# Patient Record
Sex: Female | Born: 1990 | Marital: Married | Smoking: Former smoker
Health system: Southern US, Community
[De-identification: ages and names within clinical notes are randomized; demographics above are authoritative.]

## PROBLEM LIST (undated history)

## (undated) DIAGNOSIS — C801 Malignant (primary) neoplasm, unspecified: Secondary | ICD-10-CM

## (undated) DIAGNOSIS — F329 Major depressive disorder, single episode, unspecified: Secondary | ICD-10-CM

## (undated) DIAGNOSIS — T4145XA Adverse effect of unspecified anesthetic, initial encounter: Secondary | ICD-10-CM

## (undated) DIAGNOSIS — F410 Panic disorder [episodic paroxysmal anxiety] without agoraphobia: Secondary | ICD-10-CM

## (undated) DIAGNOSIS — D069 Carcinoma in situ of cervix, unspecified: Secondary | ICD-10-CM

## (undated) DIAGNOSIS — F419 Anxiety disorder, unspecified: Secondary | ICD-10-CM

## (undated) DIAGNOSIS — B977 Papillomavirus as the cause of diseases classified elsewhere: Secondary | ICD-10-CM

## (undated) DIAGNOSIS — T8859XA Other complications of anesthesia, initial encounter: Secondary | ICD-10-CM

## (undated) DIAGNOSIS — F32A Depression, unspecified: Secondary | ICD-10-CM

## (undated) DIAGNOSIS — F4310 Post-traumatic stress disorder, unspecified: Secondary | ICD-10-CM

## (undated) HISTORY — PX: CERVICAL BIOPSY  W/ LOOP ELECTRODE EXCISION: SUR135

## (undated) HISTORY — DX: Major depressive disorder, single episode, unspecified: F32.9

## (undated) HISTORY — DX: Anxiety disorder, unspecified: F41.9

## (undated) HISTORY — DX: Carcinoma in situ of cervix, unspecified: D06.9

## (undated) HISTORY — DX: Depression, unspecified: F32.A

## (undated) HISTORY — DX: Panic disorder (episodic paroxysmal anxiety): F41.0

## (undated) HISTORY — PX: DENTAL SURGERY: SHX609

## (undated) HISTORY — DX: Papillomavirus as the cause of diseases classified elsewhere: B97.7

## (undated) NOTE — *Deleted (*Deleted)
Date of delivery: 03/18/2020 Estimated Date of Delivery: 03/18/20 Patient's last menstrual period was 05/10/2019 (approximate). EGA: [redacted]w[redacted]d  Delivery Note At 7:54 AM a viable female was delivered via Vaginal, Spontaneous (Presentation: OA, ROA).  APGAR: 7, 8; weight: pending   Placenta status: Spontaneous, Intact. Trailing membranes removed with ring forceps.  Cord: 3 vessels with the following complications: None.  Cord pH: NA  Patient with bulging bag and complete after AROM of clear fluid. She pushed well to deliver a viable female infant.  The head followed by shoulders, which delivered without difficulty, and the rest of the body.  No nuchal cord noted.  Baby to mom's chest.  Cord clamped and cut after 2 min delay.  Baby taken to warmer in care of nursery team for further transition care. Deep suctioning done with improvement of O2 Sats. Cord blood obtained.  Placenta delivered spontaneously, intact, with a 3-vessel cord.  First degree perineal laceration hemostatic and not requiring repair. Mom and baby in stable condition.  All counts correct.  Hemostasis obtained with IV pitocin and fundal massage.     Anesthesia: Epidural Episiotomy: None Lacerations: 1st degree Suture Repair: NA Est. Blood Loss (mL): 300  Mom to postpartum.  Baby to Couplet care / Skin to Skin.  Tresea Mall, CNM 03/18/2020, 8:24 AM

---

## 2017-09-06 ENCOUNTER — Telehealth: Payer: Self-pay | Admitting: Obstetrics & Gynecology

## 2017-09-06 ENCOUNTER — Encounter: Payer: Self-pay | Admitting: Obstetrics and Gynecology

## 2017-09-06 ENCOUNTER — Ambulatory Visit (INDEPENDENT_AMBULATORY_CARE_PROVIDER_SITE_OTHER): Payer: Medicaid Other | Admitting: Obstetrics and Gynecology

## 2017-09-06 VITALS — BP 118/74 | Ht 66.0 in | Wt 134.0 lb

## 2017-09-06 DIAGNOSIS — N751 Abscess of Bartholin's gland: Secondary | ICD-10-CM

## 2017-09-06 NOTE — Progress Notes (Signed)
Obstetrics & Gynecology Office Visit   Chief Complaint  Patient presents with  . Bartholin's Cyst  Referral from Gennette Pac, [place], at [facility]  History of Present Illness: 27 y.o. [phone] female who is seen in referral from Gennette Pac, [place], at [facility] for a Bartholin's cyst. She states the cyst is located on her left.  She first noted it two years ago and it didn't cause her any pain. The initial approach was to monitor it. Between 4-6 weeks ago it became larger and during sex it became painful.  She started having terrible pain 3-4 days.  She has been doing hot sitz baths, hot compresses, ice, lubricant (to wear underwear).  The cyst drained for a short period of time and then stopped (about day 2).  She was prescribed Bactrim yesterday and she is taking tylenol and ibuprofen for the pain, and the pain is somewhat improved. She still has difficulty walking and sitting, but the pain is not as bad as yesterday.  However, it is still severe.  She denies fevers, chills.  Last pap smear 1 year ago and it was normal.  She has never had an STD besides HPV.   Her menses are very irregular when not on NuvaRing. She has been off of this for some time.   Past Medical History:  Diagnosis Date  . Anxiety and depression   . 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 HPV (human papilloma virus) infection   . Panic disorder    Past Surgical History:  Procedure Laterality Date  . CERVICAL BIOPSY  W/ LOOP ELECTRODE EXCISION     Gynecologic History: Patient's last menstrual period was 08/01/2017.  Obstetric History: [id number], s/p SVD x 1 with episiotomy  Family History  Problem Relation Age of Onset  . Colon cancer Mother   . Hypertension Mother   . Stroke Mother     Social History   Socioeconomic History  . Marital status: Married    Spouse name: Not on file  . Number of children: Not on file  .  Years of education: Not on file  . Highest education level: Not on file  Occupational History  . Not on file  Social Needs  . Financial resource strain: Not on file  . Food insecurity:    Worry: Not on file    Inability: Not on file  . Transportation needs:    Medical: Not on file    Non-medical: Not on file  Tobacco Use  . Smoking status: Former Research scientist (life sciences)  . Smokeless tobacco: Never Used  Substance and Sexual Activity  . Alcohol use: Not Currently  . Drug use: Yes    Types: Marijuana  . Sexual activity: Not Currently    Birth control/protection: None  Lifestyle  . Physical activity:    Days per week: Not on file    Minutes per session: Not on file  . Stress: Not on file  Relationships  . Social connections:    Talks on phone: Not on file    Gets together: Not on file    Attends religious service: Not on file    Active member of club or organization: Not on file    Attends meetings of clubs or organizations: Not on file    Relationship status: Not on file  . Intimate partner violence:    Fear of current or ex partner: Not on file    Emotionally abused: Not  on file    Physically abused: Not on file    Forced sexual activity: Not on file  Other Topics Concern  . Not on file  Social History Narrative  . Not on file    Allergies  Allergen Reactions  . Shellfish Allergy   . Zithromax [Azithromycin]     Medications   Bactrim DS   Review of Systems  Constitutional: Negative.   HENT: Negative.   Eyes: Negative.   Respiratory: Negative.   Cardiovascular: Negative.   Gastrointestinal: Negative.   Genitourinary: Negative.        See HPI for details  Musculoskeletal: Negative.   Skin: Negative.   Neurological: Negative.   Endo/Heme/Allergies: Negative.   Psychiatric/Behavioral: Negative.      Physical Exam BP 118/74   Ht 5\' 6"  (1.676 m)   Wt 134 lb (60.8 kg)   LMP 08/01/2017   BMI 21.63 kg/m  Patient's last menstrual period was 08/01/2017. Physical Exam    Constitutional: She is oriented to person, place, and time. She appears well-developed and well-nourished. No distress.  Genitourinary: Pelvic exam was performed with patient supine. There is no rash, tenderness or lesion on the right labia.  There is tenderness, lesion and Bartholin's cyst on the left labia. There is no rash on the left labia.     HENT:  Head: Normocephalic and atraumatic.  Eyes: EOM are normal. No scleral icterus.  Neck: Normal range of motion. Neck supple.  Cardiovascular: Normal rate and regular rhythm.  Pulmonary/Chest: Effort normal and breath sounds normal. No respiratory distress. She has no wheezes. She has no rales.  Abdominal: Soft. Bowel sounds are normal. She exhibits no distension and no mass. There is no tenderness. There is no rebound and no guarding.  Musculoskeletal: Normal range of motion. She exhibits no edema.  Lymphadenopathy:       Right: No inguinal adenopathy present.       Left: No inguinal adenopathy present.  Neurological: She is alert and oriented to person, place, and time. No cranial nerve deficit.  Skin: Skin is warm and dry. No erythema.  Psychiatric: She has a normal mood and affect. Her behavior is normal. Judgment normal.   Female chaperone present for pelvic and breast  portions of the physical exam  Assessment: 27 y.o. [id number] female here for  1. Bartholin's gland abscess      Plan: Problem List Items Addressed This Visit    None    Visit Diagnoses    Bartholin's gland abscess    -  Primary     Discussed treatment of Bartholin gland abscess with patient.  Discussed conservative management with continued sitz bath, continued antibiotics, procedure with placement of Word catheter, and marsupialization. Given her exam today, it was quite difficult to place a Word catheter given the significant abscess size and location (difficult to isolate an area proximal to the hymenal ring). She elects marsupialization. Given the length of  time she has had the Bartholin gland abscess, I believe this is a reasonable idea.  Will schedule marsupialization for as soon as possible. She is to continue antibiotics. If her abscess reduces in size, may have to reschedule surgery.  25 minutes spent in face to face discussion with > 50% spent in counseling,management, and coordination of care of her left Bartholin gland abscess.   Prentice Docker, MD 09/06/2017 9:19 PM     CC: Gennette Pac, [place], [place] [id number]

## 2017-09-06 NOTE — Telephone Encounter (Signed)
[facility] clinic referring for Batholin's cyst abscess. Called and left voicemail for patient to call back.Marland KitchenMarland Kitchen

## 2017-09-06 NOTE — H&P (View-Only) (Signed)
Obstetrics & Gynecology Office Visit   Chief Complaint  Patient presents with  . Bartholin's Cyst  Referral from Gennette Pac, [place], at [facility]  History of Present Illness: 27 y.o. [phone] female who is seen in referral from Gennette Pac, [place], at [facility] for a Bartholin's cyst. She states the cyst is located on her left.  She first noted it two years ago and it didn't cause her any pain. The initial approach was to monitor it. Between 4-6 weeks ago it became larger and during sex it became painful.  She started having terrible pain 3-4 days.  She has been doing hot sitz baths, hot compresses, ice, lubricant (to wear underwear).  The cyst drained for a short period of time and then stopped (about day 2).  She was prescribed Bactrim yesterday and she is taking tylenol and ibuprofen for the pain, and the pain is somewhat improved. She still has difficulty walking and sitting, but the pain is not as bad as yesterday.  However, it is still severe.  She denies fevers, chills.  Last pap smear 1 year ago and it was normal.  She has never had an STD besides HPV.   Her menses are very irregular when not on NuvaRing. She has been off of this for some time.   Past Medical History:  Diagnosis Date  . Anxiety and depression   . 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 HPV (human papilloma virus) infection   . Panic disorder    Past Surgical History:  Procedure Laterality Date  . CERVICAL BIOPSY  W/ LOOP ELECTRODE EXCISION     Gynecologic History: Patient's last menstrual period was 08/01/2017.  Obstetric History: [id number], s/p SVD x 1 with episiotomy  Family History  Problem Relation Age of Onset  . Colon cancer Mother   . Hypertension Mother   . Stroke Mother     Social History   Socioeconomic History  . Marital status: Married    Spouse name: Not on file  . Number of children: Not on file  .  Years of education: Not on file  . Highest education level: Not on file  Occupational History  . Not on file  Social Needs  . Financial resource strain: Not on file  . Food insecurity:    Worry: Not on file    Inability: Not on file  . Transportation needs:    Medical: Not on file    Non-medical: Not on file  Tobacco Use  . Smoking status: Former Research scientist (life sciences)  . Smokeless tobacco: Never Used  Substance and Sexual Activity  . Alcohol use: Not Currently  . Drug use: Yes    Types: Marijuana  . Sexual activity: Not Currently    Birth control/protection: None  Lifestyle  . Physical activity:    Days per week: Not on file    Minutes per session: Not on file  . Stress: Not on file  Relationships  . Social connections:    Talks on phone: Not on file    Gets together: Not on file    Attends religious service: Not on file    Active member of club or organization: Not on file    Attends meetings of clubs or organizations: Not on file    Relationship status: Not on file  . Intimate partner violence:    Fear of current or ex partner: Not on file    Emotionally abused: Not  on file    Physically abused: Not on file    Forced sexual activity: Not on file  Other Topics Concern  . Not on file  Social History Narrative  . Not on file    Allergies  Allergen Reactions  . Shellfish Allergy   . Zithromax [Azithromycin]     Medications   Bactrim DS   Review of Systems  Constitutional: Negative.   HENT: Negative.   Eyes: Negative.   Respiratory: Negative.   Cardiovascular: Negative.   Gastrointestinal: Negative.   Genitourinary: Negative.        See HPI for details  Musculoskeletal: Negative.   Skin: Negative.   Neurological: Negative.   Endo/Heme/Allergies: Negative.   Psychiatric/Behavioral: Negative.      Physical Exam BP 118/74   Ht 5\' 6"  (1.676 m)   Wt 134 lb (60.8 kg)   LMP 08/01/2017   BMI 21.63 kg/m  Patient's last menstrual period was 08/01/2017. Physical Exam    Constitutional: She is oriented to person, place, and time. She appears well-developed and well-nourished. No distress.  Genitourinary: Pelvic exam was performed with patient supine. There is no rash, tenderness or lesion on the right labia.  There is tenderness, lesion and Bartholin's cyst on the left labia. There is no rash on the left labia.     HENT:  Head: Normocephalic and atraumatic.  Eyes: EOM are normal. No scleral icterus.  Neck: Normal range of motion. Neck supple.  Cardiovascular: Normal rate and regular rhythm.  Pulmonary/Chest: Effort normal and breath sounds normal. No respiratory distress. She has no wheezes. She has no rales.  Abdominal: Soft. Bowel sounds are normal. She exhibits no distension and no mass. There is no tenderness. There is no rebound and no guarding.  Musculoskeletal: Normal range of motion. She exhibits no edema.  Lymphadenopathy:       Right: No inguinal adenopathy present.       Left: No inguinal adenopathy present.  Neurological: She is alert and oriented to person, place, and time. No cranial nerve deficit.  Skin: Skin is warm and dry. No erythema.  Psychiatric: She has a normal mood and affect. Her behavior is normal. Judgment normal.   Female chaperone present for pelvic and breast  portions of the physical exam  Assessment: 27 y.o. [id number] female here for  1. Bartholin's gland abscess      Plan: Problem List Items Addressed This Visit    None    Visit Diagnoses    Bartholin's gland abscess    -  Primary     Discussed treatment of Bartholin gland abscess with patient.  Discussed conservative management with continued sitz bath, continued antibiotics, procedure with placement of Word catheter, and marsupialization. Given her exam today, it was quite difficult to place a Word catheter given the significant abscess size and location (difficult to isolate an area proximal to the hymenal ring). She elects marsupialization. Given the length of  time she has had the Bartholin gland abscess, I believe this is a reasonable idea.  Will schedule marsupialization for as soon as possible. She is to continue antibiotics. If her abscess reduces in size, may have to reschedule surgery.  25 minutes spent in face to face discussion with > 50% spent in counseling,management, and coordination of care of her left Bartholin gland abscess.   Prentice Docker, MD 09/06/2017 9:19 PM     CC: Gennette Pac, [place] [address] [place], [place] [id number]

## 2017-09-06 NOTE — Telephone Encounter (Signed)
Patient schedule 4//30/19 with SDJ

## 2017-09-07 ENCOUNTER — Encounter
Admission: RE | Admit: 2017-09-07 | Discharge: 2017-09-07 | Disposition: A | Discharge status: Home / Self Care | Payer: Medicaid Other | Source: Ambulatory Visit | Attending: Obstetrics and Gynecology | Admitting: Obstetrics and Gynecology

## 2017-09-07 ENCOUNTER — Encounter: Payer: Self-pay | Admitting: *Deleted

## 2017-09-07 ENCOUNTER — Other Ambulatory Visit: Payer: Self-pay

## 2017-09-07 HISTORY — DX: Other complications of anesthesia, initial encounter: T88.59XA

## 2017-09-07 HISTORY — DX: Malignant (primary) neoplasm, unspecified: C80.1

## 2017-09-07 HISTORY — DX: Adverse effect of unspecified anesthetic, initial encounter: T41.45XA

## 2017-09-07 HISTORY — DX: Major depressive disorder, single episode, unspecified: F32.9

## 2017-09-07 HISTORY — DX: Post-traumatic stress disorder, unspecified: F43.10

## 2017-09-07 HISTORY — DX: Depression, unspecified: F32.A

## 2017-09-07 NOTE — Patient Instructions (Signed)
Your procedure is scheduled on: 09/09/17 Report to Day Surgery. [facility]OND FLOOR To find out your arrival time please call [phone] between 1PM - 3PM on  09/08/17.  Remember: Instructions that are not followed completely may result in serious medical risk, up to and including death, or upon the discretion of your surgeon and anesthesiologist your surgery may need to be rescheduled.     _X__ 1. Do not eat food after midnight the night before your procedure.                 No gum chewing or hard candies. You may drink clear liquids up to 2 hours                 before you are scheduled to arrive for your surgery- DO not drink clear                 liquids within 2 hours of the start of your surgery.                 Clear Liquids include:  water, apple juice without pulp, clear carbohydrate                 drink such as Clearfast of Gartorade, Black Coffee or Tea (Do not add                 anything to coffee or tea).  __X__2.  On the morning of surgery brush your teeth with toothpaste and water, you                 may rinse your mouth with mouthwash if you wish.  Do not swallow any              toothpaste of mouthwash.     _X__ 3.  No Alcohol for 24 hours before or after surgery.   _X__ 4.  Do Not Smoke or use e-cigarettes For 24 Hours Prior to Your Surgery.                 Do not use any chewable tobacco products for at least 6 hours prior to                 surgery.  ____  5.  Bring all medications with you on the day of surgery if instructed.   _X___  6.  Notify your doctor if there is any change in your medical condition      (cold, fever, infections).     Do not wear jewelry, make-up, hairpins, clips or nail polish. Do not wear lotions, powders, or perfumes. You may wear deodorant. Do not shave 48 hours prior to surgery. Men may shave face and neck. Do not bring valuables to the hospital.    [facility] is not responsible for any belongings or  valuables.  Contacts, dentures or bridgework may not be worn into surgery. Leave your suitcase in the car. After surgery it may be brought to your room. For patients admitted to the hospital, discharge time is determined by your treatment team.   Patients discharged the day of surgery will not be allowed to drive home.   ____ Take these medicines the morning of surgery with A SIP OF WATER:    1.N0NE  2.   3.   4.  5.  6.  ____ Fleet Enema (as directed)   ____ Use CHG Soap as directed  ____ Use inhalers on the day of  surgery  ____ Stop metformin 2 days prior to surgery    ____ Take 1/2 of usual insulin dose the night before surgery. No insulin the morning          of surgery.   ____ Stop Coumadin/Plavix/aspirin on   ____ Stop Anti-inflammatories on    ____ Stop supplements until after surgery.    ____ Bring C-Pap to the hospital.

## 2017-09-09 ENCOUNTER — Ambulatory Visit: Payer: Medicaid Other | Admitting: Anesthesiology

## 2017-09-09 ENCOUNTER — Encounter
Admission: RE | Disposition: A | Discharge status: Home / Self Care | Payer: Self-pay | Source: Ambulatory Visit | Attending: Obstetrics and Gynecology

## 2017-09-09 ENCOUNTER — Other Ambulatory Visit: Payer: Self-pay

## 2017-09-09 ENCOUNTER — Encounter: Payer: Self-pay | Admitting: *Deleted

## 2017-09-09 ENCOUNTER — Ambulatory Visit
Admission: RE | Admit: 2017-09-09 | Discharge: 2017-09-09 | Disposition: A | Discharge status: Home / Self Care | Payer: Medicaid Other | Source: Ambulatory Visit | Attending: Obstetrics and Gynecology | Admitting: Obstetrics and Gynecology

## 2017-09-09 DIAGNOSIS — D069 Carcinoma in situ of cervix, unspecified: Secondary | ICD-10-CM | POA: Diagnosis not present

## 2017-09-09 DIAGNOSIS — N750 Cyst of Bartholin's gland: Secondary | ICD-10-CM | POA: Diagnosis not present

## 2017-09-09 DIAGNOSIS — Z79899 Other long term (current) drug therapy: Secondary | ICD-10-CM | POA: Insufficient documentation

## 2017-09-09 DIAGNOSIS — N751 Abscess of Bartholin's gland: Secondary | ICD-10-CM | POA: Diagnosis present

## 2017-09-09 DIAGNOSIS — Z87891 Personal history of nicotine dependence: Secondary | ICD-10-CM | POA: Insufficient documentation

## 2017-09-09 DIAGNOSIS — F419 Anxiety disorder, unspecified: Secondary | ICD-10-CM | POA: Insufficient documentation

## 2017-09-09 HISTORY — PX: BARTHOLIN CYST MARSUPIALIZATION: SHX5383

## 2017-09-09 LAB — URINE DRUG SCREEN, QUALITATIVE (ARMC ONLY)
AMPHETAMINES, UR SCREEN: NOT DETECTED
Barbiturates, Ur Screen: NOT DETECTED
Benzodiazepine, Ur Scrn: NOT DETECTED
CANNABINOID 50 NG, UR ~~LOC~~: POSITIVE — AB
Cocaine Metabolite,Ur ~~LOC~~: NOT DETECTED
MDMA (ECSTASY) UR SCREEN: NOT DETECTED
Methadone Scn, Ur: NOT DETECTED
OPIATE, UR SCREEN: NOT DETECTED
PHENCYCLIDINE (PCP) UR S: NOT DETECTED
Tricyclic, Ur Screen: NOT DETECTED

## 2017-09-09 LAB — CHLAMYDIA/NGC RT PCR (ARMC ONLY)
CHLAMYDIA TR: NOT DETECTED
N gonorrhoeae: NOT DETECTED

## 2017-09-09 LAB — PREGNANCY, URINE: PREG TEST UR: NEGATIVE

## 2017-09-09 LAB — POCT PREGNANCY, URINE: Preg Test, Ur: NEGATIVE

## 2017-09-09 SURGERY — MARSUPIALIZATION, CYST, BARTHOLIN'S GLAND
Anesthesia: General | Wound class: Dirty or Infected

## 2017-09-09 MED ORDER — KETOROLAC TROMETHAMINE 30 MG/ML IJ SOLN
INTRAMUSCULAR | Status: DC | PRN
  Administered 2017-09-09: 30 mg via INTRAVENOUS

## 2017-09-09 MED ORDER — FENTANYL CITRATE (PF) 100 MCG/2ML IJ SOLN
INTRAMUSCULAR | Status: AC
  Administered 2017-09-09: 25 ug via INTRAVENOUS
  Filled 2017-09-09: qty 2

## 2017-09-09 MED ORDER — FENTANYL CITRATE (PF) 100 MCG/2ML IJ SOLN
INTRAMUSCULAR | Status: DC | PRN
  Administered 2017-09-09 (×3): 50 ug via INTRAVENOUS

## 2017-09-09 MED ORDER — FENTANYL CITRATE (PF) 100 MCG/2ML IJ SOLN
25.0000 ug | INTRAMUSCULAR | Status: AC | PRN
  Administered 2017-09-09 (×2): 25 ug via INTRAVENOUS

## 2017-09-09 MED ORDER — BUPIVACAINE-EPINEPHRINE (PF) 0.25% -1:200000 IJ SOLN
INTRAMUSCULAR | Status: AC
  Filled 2017-09-09: qty 30

## 2017-09-09 MED ORDER — FAMOTIDINE 20 MG PO TABS
ORAL_TABLET | ORAL | Status: AC
  Administered 2017-09-09: 20 mg via ORAL
  Filled 2017-09-09: qty 1

## 2017-09-09 MED ORDER — PROPOFOL 10 MG/ML IV BOLUS
INTRAVENOUS | Status: AC
  Filled 2017-09-09: qty 20

## 2017-09-09 MED ORDER — HYDROMORPHONE HCL 1 MG/ML IJ SOLN
INTRAMUSCULAR | Status: AC
Start: 2017-09-09 — End: 2017-09-09
  Filled 2017-09-09: qty 1

## 2017-09-09 MED ORDER — OXYCODONE HCL 5 MG PO TABS
ORAL_TABLET | ORAL | Status: AC
  Administered 2017-09-09: 5 mg via ORAL
  Filled 2017-09-09: qty 1

## 2017-09-09 MED ORDER — LIDOCAINE HCL (CARDIAC) PF 100 MG/5ML IV SOSY
PREFILLED_SYRINGE | INTRAVENOUS | Status: DC | PRN
  Administered 2017-09-09: 60 mg via INTRAVENOUS

## 2017-09-09 MED ORDER — ONDANSETRON HCL 4 MG/2ML IJ SOLN
INTRAMUSCULAR | Status: AC
  Filled 2017-09-09: qty 2

## 2017-09-09 MED ORDER — OXYCODONE HCL 5 MG PO TABS
5.0000 mg | ORAL_TABLET | Freq: Once | ORAL | Status: AC | PRN
  Administered 2017-09-09: 5 mg via ORAL

## 2017-09-09 MED ORDER — BUPIVACAINE-EPINEPHRINE (PF) 0.25% -1:200000 IJ SOLN
INTRAMUSCULAR | Status: DC | PRN
  Administered 2017-09-09: 2 mL via PERINEURAL

## 2017-09-09 MED ORDER — LACTATED RINGERS IV SOLN
INTRAVENOUS | Status: DC
  Administered 2017-09-09: 09:00:00 via INTRAVENOUS

## 2017-09-09 MED ORDER — PROMETHAZINE HCL 25 MG/ML IJ SOLN: 6.2500 mg | INTRAMUSCULAR | Status: DC | PRN

## 2017-09-09 MED ORDER — DEXAMETHASONE SODIUM PHOSPHATE 10 MG/ML IJ SOLN: INTRAMUSCULAR | Status: DC | PRN

## 2017-09-09 MED ORDER — FAMOTIDINE 20 MG PO TABS
20.0000 mg | ORAL_TABLET | Freq: Once | ORAL | Status: AC
  Administered 2017-09-09: 20 mg via ORAL

## 2017-09-09 MED ORDER — MIDAZOLAM HCL 2 MG/2ML IJ SOLN
INTRAMUSCULAR | Status: AC
  Filled 2017-09-09: qty 2

## 2017-09-09 MED ORDER — MEPERIDINE HCL 50 MG/ML IJ SOLN: 6.2500 mg | INTRAMUSCULAR | Status: DC | PRN

## 2017-09-09 MED ORDER — FENTANYL CITRATE (PF) 100 MCG/2ML IJ SOLN
25.0000 ug | INTRAMUSCULAR | Status: DC | PRN
  Administered 2017-09-09 (×2): 50 ug via INTRAVENOUS
  Administered 2017-09-09 (×2): 25 ug via INTRAVENOUS

## 2017-09-09 MED ORDER — FENTANYL CITRATE (PF) 100 MCG/2ML IJ SOLN
INTRAMUSCULAR | Status: AC
  Administered 2017-09-09: 50 ug via INTRAVENOUS
  Filled 2017-09-09: qty 2

## 2017-09-09 MED ORDER — ONDANSETRON HCL 4 MG/2ML IJ SOLN
INTRAMUSCULAR | Status: DC | PRN
  Administered 2017-09-09: 4 mg via INTRAVENOUS

## 2017-09-09 MED ORDER — LACTATED RINGERS IV SOLN: INTRAVENOUS | Status: DC

## 2017-09-09 MED ORDER — HYDROMORPHONE HCL 1 MG/ML IJ SOLN
INTRAMUSCULAR | Status: DC | PRN
  Administered 2017-09-09: 0.5 mg via INTRAVENOUS

## 2017-09-09 MED ORDER — FENTANYL CITRATE (PF) 250 MCG/5ML IJ SOLN
INTRAMUSCULAR | Status: AC
Start: 2017-09-09 — End: 2017-09-09
  Filled 2017-09-09: qty 5

## 2017-09-09 MED ORDER — PROPOFOL 10 MG/ML IV BOLUS
INTRAVENOUS | Status: DC | PRN
  Administered 2017-09-09: 150 mg via INTRAVENOUS

## 2017-09-09 MED ORDER — IBUPROFEN 600 MG PO TABS: 600.0000 mg | ORAL_TABLET | Freq: Four times a day (QID) | ORAL | 0 refills | Status: DC | PRN

## 2017-09-09 MED ORDER — OXYCODONE HCL 5 MG/5ML PO SOLN: 5.0000 mg | Freq: Once | ORAL | Status: AC | PRN

## 2017-09-09 MED ORDER — DEXAMETHASONE SODIUM PHOSPHATE 10 MG/ML IJ SOLN
INTRAMUSCULAR | Status: DC | PRN
  Administered 2017-09-09: 5 mg via INTRAVENOUS

## 2017-09-09 MED ORDER — HYDROCODONE-ACETAMINOPHEN 5-325 MG PO TABS: 1.0000 | ORAL_TABLET | Freq: Three times a day (TID) | ORAL | 0 refills | Status: DC | PRN

## 2017-09-09 SURGICAL SUPPLY — 24 items
BLADE SURG 15 STRL LF DISP TIS (BLADE) ×1 IMPLANT
BLADE SURG 15 STRL SS (BLADE) ×2
CATH ROBINSON RED A/P 16FR (CATHETERS) ×3 IMPLANT
DRAPE PERI LITHO V/GYN (MISCELLANEOUS) ×3 IMPLANT
DRAPE UNDER BUTTOCK W/FLU (DRAPES) ×3 IMPLANT
ELECT REM PT RETURN 9FT ADLT (ELECTROSURGICAL) ×3
ELECTRODE REM PT RTRN 9FT ADLT (ELECTROSURGICAL) ×1 IMPLANT
GLOVE BIO SURGEON STRL SZ7 (GLOVE) ×3 IMPLANT
GLOVE INDICATOR 7.5 STRL GRN (GLOVE) ×3 IMPLANT
GOWN STRL REUS W/ TWL LRG LVL3 (GOWN DISPOSABLE) ×2 IMPLANT
GOWN STRL REUS W/TWL LRG LVL3 (GOWN DISPOSABLE) ×4
KIT TURNOVER CYSTO (KITS) ×3 IMPLANT
NEEDLE HYPO 22GX1.5 SAFETY (NEEDLE) ×3 IMPLANT
NS IRRIG 500ML POUR BTL (IV SOLUTION) ×3 IMPLANT
PACK BASIN MINOR ARMC (MISCELLANEOUS) ×3 IMPLANT
PAD OB MATERNITY 4.3X12.25 (PERSONAL CARE ITEMS) ×3 IMPLANT
PAD PREP 24X41 OB/GYN DISP (PERSONAL CARE ITEMS) ×3 IMPLANT
SPONGE XRAY 4X4 16PLY STRL (MISCELLANEOUS) ×3 IMPLANT
SUT VIC AB 2-0 CT1 27 (SUTURE)
SUT VIC AB 2-0 CT1 TAPERPNT 27 (SUTURE) IMPLANT
SUT VIC AB 3-0 SH 27 (SUTURE) ×2
SUT VIC AB 3-0 SH 27X BRD (SUTURE) ×1 IMPLANT
SYR 10ML LL (SYRINGE) ×3 IMPLANT
SYR BULB IRRIG 60ML STRL (SYRINGE) ×3 IMPLANT

## 2017-09-09 NOTE — Interval H&P Note (Signed)
History and Physical Interval Note:  09/09/2017 8:39 AM  Anna Woodard  has presented today for surgery, with the diagnosis of N/A  The various methods of treatment have been discussed with the patient and family. After consideration of risks, benefits and other options for treatment, the patient has consented to  Procedure(s): BARTHOLIN GLAND MARSUPIALIZATION (N/A) as a surgical intervention .  The patient's history has been reviewed, patient examined, no change in status, stable for surgery.  I have reviewed the patient's chart and labs.  Questions were answered to the patient's satisfaction.    Prentice Docker, MD, [facility], [facility] 09/09/2017 8:39 AM

## 2017-09-09 NOTE — Discharge Instructions (Signed)

## 2017-09-09 NOTE — Anesthesia Postprocedure Evaluation (Signed)
Anesthesia Post Note  Patient: Anna Woodard  Procedure(s) Performed: BARTHOLIN CYST MARSUPIALIZATION (N/A )  Patient location during evaluation: PACU Anesthesia Type: General Level of consciousness: awake and alert and oriented Pain management: pain level controlled Vital Signs Assessment: post-procedure vital signs reviewed and stable Respiratory status: spontaneous breathing, nonlabored ventilation and respiratory function stable Cardiovascular status: blood pressure returned to baseline and stable Postop Assessment: no signs of nausea or vomiting Anesthetic complications: no     Last Vitals:  Vitals:   09/09/17 1150 09/09/17 1208  BP: 116/69 106/61  Pulse: 68 61  Resp: 12 16  Temp: 37.1 C 36.8 C  SpO2: 99% 99%    Last Pain:  Vitals:   09/09/17 1208  TempSrc: Oral  PainSc: 3                  Garald Rhew

## 2017-09-09 NOTE — Op Note (Signed)
  Operative Note    Pre-Op Diagnosis: Left Bartholin Gland Abscess  Post-Op Diagnosis: Left Bartholin Gland Abscess  Procedures:  Left Bartholin Gland marsupialization  Primary Surgeon: Prentice Docker, MD   EBL: 30 mL   IVF: 1,000 mL   Urine output: n/a  Specimens: wound aerobic/anaerobic culture  Drains: none  Complications: None   Disposition: PACU   Condition: Stable   Findings: approximately 4 cm left Bartholin Gland abscess with return of copious purulent material on drainage  Procedure Summary:  The patient was taken to the operating room where general anesthesia was administered and found to be adequate. She was placed in the dorsal supine lithotomy position in candy cane stirrups and prepped and draped in usual sterile fashion using Hibaclense. After a timeout was called a sterile swab was obtained for gonorrhea/chlamydia NAAT.    The extent of the abscess was assessed and, using a sterile marker, an approximately 2 -3 cm incision line was marked with the marker. An incision was made of the overlying skin.  This was gently retracted with Allis clamps. The abscess was then entered with copious drainage of purulent material.  A swab was obtained for aerobic/anaerobic culture.  A hemostat was used to break up any loculation within the abscess.  The lining of the abscess was then sewn to the vulvar skin lateral and the introitus medially using 3-0 vicryl interrupted stitches.  The cyst/abscess cavity was then irrigated copiously with sterile saline.  Hemostasis was verified along the edges of the incision.  Of note, approximately 2 mL of 0.25% Bupivicaine with epinephrine was injected around the vulvar area of the abscess for post-operative pain control.  This concluded the procedure.   The patient tolerated the procedure well.  Sponge, lap, needle, and instrument counts were correct x 2.  VTE prophylaxis: SCDs. Antibiotic prophylaxis: none given and none indicated. She was  awakened in the operating room and was taken to the PACU in stable condition.   Prentice Docker, MD 09/09/2017 10:25 AM

## 2017-09-09 NOTE — Anesthesia Procedure Notes (Signed)
Procedure Name: LMA Insertion Date/Time: 09/09/2017 9:25 AM Performed by: Justus Memory, CRNA Pre-anesthesia Checklist: Patient identified, Patient being monitored, Timeout performed, Emergency Drugs available and Suction available Patient Re-evaluated:Patient Re-evaluated prior to induction Oxygen Delivery Method: Circle system utilized Preoxygenation: Pre-oxygenation with 100% oxygen Induction Type: IV induction Ventilation: Mask ventilation without difficulty LMA: LMA inserted LMA Size: 3.5 Tube type: Oral Number of attempts: 1 Placement Confirmation: positive ETCO2 and breath sounds checked- equal and bilateral Tube secured with: Tape Dental Injury: Teeth and Oropharynx as per pre-operative assessment

## 2017-09-09 NOTE — Anesthesia Post-op Follow-up Note (Signed)
Anesthesia QCDR form completed.        

## 2017-09-09 NOTE — Anesthesia Preprocedure Evaluation (Signed)
Anesthesia Evaluation  Patient identified by MRN, date of birth, ID band Patient awake    Reviewed: Allergy & Precautions, NPO status , Patient's Chart, lab work & pertinent test results  History of Anesthesia Complications (+) Emergence Delirium and history of anesthetic complications  Airway Mallampati: I  TM Distance: >3 FB Neck ROM: Full    Dental  (+) Implants   Pulmonary neg sleep apnea, neg COPD, former smoker (quit in last month),    breath sounds clear to auscultation- rhonchi (-) wheezing      Cardiovascular Exercise Tolerance: Good (-) hypertension(-) angina(-) CAD, (-) Past MI, (-) Cardiac Stents and (-) CABG  Rhythm:Regular Rate:Normal - Systolic murmurs and - Diastolic murmurs    Neuro/Psych PSYCHIATRIC DISORDERS Anxiety Depression negative neurological ROS     GI/Hepatic negative GI ROS, Neg liver ROS,   Endo/Other  negative endocrine ROSneg diabetes  Renal/GU negative Renal ROS     Musculoskeletal negative musculoskeletal ROS (+)   Abdominal (+) - obese,   Peds  Hematology negative hematology ROS (+)   Anesthesia Other Findings Past Medical History: No date: Anxiety and depression No date: Cancer ([place])     Comment:  CERBVICAL No date: CIN III (cervical intraepithelial neoplasia grade III) with  severe dysplasia No date: Complication of anesthesia     Comment:  WAKES UP IN PANIC / CRYING No date: Depression No date: HPV (human papilloma virus) infection No date: Panic disorder No date: Panic disorder No date: PTSD (post-traumatic stress disorder)   Reproductive/Obstetrics                             Anesthesia Physical Anesthesia Plan  ASA: II  Anesthesia Plan: General   Post-op Pain Management:    Induction: Intravenous  PONV Risk Score and Plan: 2 and Ondansetron, Dexamethasone and Midazolam  Airway Management Planned: LMA  Additional Equipment:    Intra-op Plan:   Post-operative Plan:   Informed Consent: I have reviewed the patients History and Physical, chart, labs and discussed the procedure including the risks, benefits and alternatives for the proposed anesthesia with the patient or authorized representative who has indicated his/her understanding and acceptance.   Dental advisory given  Plan Discussed with: CRNA and Anesthesiologist  Anesthesia Plan Comments:         Anesthesia Quick Evaluation

## 2017-09-09 NOTE — Transfer of Care (Signed)
Immediate Anesthesia Transfer of Care Note  Patient: Anna Woodard  Procedure(s) Performed: BARTHOLIN CYST MARSUPIALIZATION (N/A )  Patient Location: PACU  Anesthesia Type:General  Level of Consciousness: sedated  Airway & Oxygen Therapy: Patient Spontanous Breathing  Post-op Assessment: Report given to RN and Post -op Vital signs reviewed and stable  Post vital signs: Reviewed and stable  Last Vitals:  Vitals Value Taken Time  BP 119/73 09/09/2017 10:36 AM  Temp 37.1 C 09/09/2017 10:36 AM  Pulse 85 09/09/2017 10:48 AM  Resp 22 09/09/2017 10:48 AM  SpO2 100 % 09/09/2017 10:48 AM    Last Pain:  Vitals:   09/09/17 1048  TempSrc:   PainSc: 8       Patients Stated Pain Goal: 3 (88/50/27 7412)  Complications: No apparent anesthesia complications

## 2017-09-12 ENCOUNTER — Telehealth: Payer: Self-pay

## 2017-09-12 NOTE — Telephone Encounter (Signed)
Please advise/send in new antibiotic since SDJ out of office all week please.

## 2017-09-12 NOTE — Telephone Encounter (Signed)
Tried to call patient, she did not answer. Left message asking her to return my phone call.

## 2017-09-12 NOTE — Telephone Encounter (Signed)
Pt husband calling to find out what new antibiotic his wife needed? She has been having some cramping pain on and off, off white discharge, she had a culture sent in from her  Bartholin cyst on Friday 3rd. Please call pt and let her know what new meds she should be on and go over results

## 2017-09-13 NOTE — Telephone Encounter (Signed)
Please let CS or another provider know when pt returns call since SDJ out all week

## 2017-09-14 LAB — AEROBIC/ANAEROBIC CULTURE (SURGICAL/DEEP WOUND)

## 2017-09-14 LAB — AEROBIC/ANAEROBIC CULTURE W GRAM STAIN (SURGICAL/DEEP WOUND)

## 2017-09-14 NOTE — Telephone Encounter (Signed)
Pt called triage today to request results from culture done from bartholin cyst and needs instructions going forward. Pt cb# [phone]. Thank you.

## 2017-09-14 NOTE — Telephone Encounter (Signed)
No change needed in ABX Keep follow up

## 2017-09-15 NOTE — Telephone Encounter (Signed)
Trying to call pt. Anna Woodard with her. No change needed in antibiotics per [facility]. Pt to keep f/u

## 2017-09-23 ENCOUNTER — Ambulatory Visit (INDEPENDENT_AMBULATORY_CARE_PROVIDER_SITE_OTHER): Payer: Medicaid Other | Admitting: Obstetrics and Gynecology

## 2017-09-23 ENCOUNTER — Encounter: Payer: Self-pay | Admitting: Obstetrics and Gynecology

## 2017-09-23 VITALS — BP 100/60 | Ht 67.0 in | Wt 129.0 lb

## 2017-09-23 DIAGNOSIS — N751 Abscess of Bartholin's gland: Secondary | ICD-10-CM

## 2017-09-23 MED ORDER — FLUCONAZOLE 150 MG PO TABS: 150.0000 mg | ORAL_TABLET | Freq: Once | ORAL | 0 refills | Status: AC

## 2017-09-23 MED ORDER — AMOXICILLIN-POT CLAVULANATE 875-125 MG PO TABS: 1.0000 | ORAL_TABLET | Freq: Two times a day (BID) | ORAL | 0 refills | Status: DC

## 2017-09-23 NOTE — Progress Notes (Signed)
   Postoperative Follow-up Patient presents post op from left bartholin's gland marsupialization 2 weeks ago for left bartholin's gland abscess.  Subjective: Patient reports marked improvement in her preop symptoms. Eating a regular diet without difficulty. Pain is controlled without any medications.  Activity: progressing normally.  Feels like she is still sore and there is still some swelling  Objective: Vitals:   09/23/17 1126  BP: 100/60   Vital Signs: BP 100/60   Ht 5\' 7"  (1.702 m)   Wt 129 lb (58.5 kg)   LMP 09/12/2017 (Exact Date)   BMI 20.20 kg/m  Constitutional: Well nourished, well developed female in no acute distress.  HEENT: normal Skin: Warm and dry.  Extremity: no edema  Abdomen: Soft, non-tender, normal bowel sounds; no bruits, organomegaly or masses.  Pelvic exam: (female chaperone present) Left bartholin gland still with mild induration and erythema, mildly ttp.  No obvious exudate.  Stitches still in place and still patent opening.    Assessment: 27 y.o. s/p Left Bartholin Gland marsupialization progressing well, still with evidence of at least mild infection.  Culture difficult to interpret overall.    Plan: Patient has done well after surgery with no apparent complications.  I have discussed the post-operative course to date, and the expected progress moving forward.  The patient understands what complications to be concerned about.  I will see the patient in 2 weeks to assess treatment with Augmentin  Activity plan: pelvic rest for 2-4 weeks  Prentice Docker, MD 09/23/2017, 11:42 AM

## 2017-09-28 ENCOUNTER — Encounter: Payer: Self-pay | Admitting: Obstetrics and Gynecology

## 2017-10-07 ENCOUNTER — Encounter: Payer: Self-pay | Admitting: Obstetrics and Gynecology

## 2017-10-07 ENCOUNTER — Ambulatory Visit (INDEPENDENT_AMBULATORY_CARE_PROVIDER_SITE_OTHER): Payer: Medicaid Other | Admitting: Obstetrics and Gynecology

## 2017-10-07 VITALS — BP 108/64 | HR 93 | Ht 67.0 in | Wt 130.0 lb

## 2017-10-07 DIAGNOSIS — Z09 Encounter for follow-up examination after completed treatment for conditions other than malignant neoplasm: Secondary | ICD-10-CM

## 2017-10-07 DIAGNOSIS — N751 Abscess of Bartholin's gland: Secondary | ICD-10-CM

## 2017-10-07 NOTE — Progress Notes (Signed)
   Postoperative Follow-up Patient presents post op from left bartholin's gland marsupialization 1 month ago for left Bartholin's gland abscess.  Subjective: Patient reports marked improvement in her preop symptoms. Eating a regular diet without difficulty. The patient is not having any pain.  Activity: normal activities of daily living.  Objective: Vitals:   10/07/17 1352  BP: 108/64  Pulse: 93   Vital Signs: BP 108/64   Pulse 93   Ht 5\' 7"  (1.702 m)   Wt 130 lb (59 kg)   LMP 09/12/2017 (Exact Date)   BMI 20.36 kg/m  Constitutional: Well nourished, well developed female in no acute distress.  HEENT: normal Skin: Warm and dry.  Extremity: no edema  Abdomen: Soft, non-tender, normal bowel sounds; no bruits, organomegaly or masses.  Pelvic exam: (female chaperone present) Focused on left Bartholin's only.  New os is still patent. Stitches in place and removed. No erythema, induration, warmth, and tenderness.   Assessment: 27 y.o. s/p left bartholin's gland marsupialization progressing well  Plan: Patient has done well after surgery with no apparent complications.  I have discussed the post-operative course to date, and the expected progress moving forward.  The patient understands what complications to be concerned about.  I will see the patient in routine follow up, or sooner if needed.    Activity plan: No restriction.  May resume intercourse after another 2-3 weeks.  Prentice Docker, MD 10/07/2017, 6:03 PM

## 2017-12-13 ENCOUNTER — Ambulatory Visit: Payer: Medicaid Other | Admitting: Obstetrics and Gynecology

## 2017-12-21 ENCOUNTER — Ambulatory Visit (INDEPENDENT_AMBULATORY_CARE_PROVIDER_SITE_OTHER): Payer: Medicaid Other | Admitting: Obstetrics and Gynecology

## 2017-12-21 ENCOUNTER — Encounter: Payer: Self-pay | Admitting: Obstetrics and Gynecology

## 2017-12-21 VITALS — BP 92/62 | HR 79 | Ht 67.0 in | Wt 132.0 lb

## 2017-12-21 DIAGNOSIS — Z30015 Encounter for initial prescription of vaginal ring hormonal contraceptive: Secondary | ICD-10-CM | POA: Diagnosis not present

## 2017-12-21 DIAGNOSIS — N926 Irregular menstruation, unspecified: Secondary | ICD-10-CM | POA: Diagnosis not present

## 2017-12-21 DIAGNOSIS — Z3202 Encounter for pregnancy test, result negative: Secondary | ICD-10-CM | POA: Diagnosis not present

## 2017-12-21 LAB — POCT URINE PREGNANCY: PREG TEST UR: NEGATIVE

## 2017-12-21 MED ORDER — ETONOGESTREL-ETHINYL ESTRADIOL 0.12-0.015 MG/24HR VA RING: VAGINAL_RING | VAGINAL | 4 refills | Status: DC

## 2017-12-21 NOTE — Progress Notes (Signed)
Obstetrics & Gynecology Office Visit   Chief Complaint  Patient presents with  . Contraception   History of Present Illness: Patient is a 27 y.o. [id number] presenting for contraception consult.  She is currently on none and desiring to start NuvaRing vaginal inserts.  She has a past medical history significant for no contraindication to estrogen.  She specifically denies a history of migraine with aura, chronic hypertension, history of DVT/PE and smoking.  Reported Patient's last menstrual period was 11/15/2017 (approximate).  She has used NuvaRing in the past without any issues.   Past Medical History:  Diagnosis Date  . Anxiety and depression   . Cancer ([place])    CERBVICAL  . 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 Complication of anesthesia    WAKES UP IN PANIC / CRYING  . Depression   . HPV (human papilloma virus) infection   . Panic disorder   . Panic disorder   . PTSD (post-traumatic stress disorder)     Past Surgical History:  Procedure Laterality Date  . BARTHOLIN CYST MARSUPIALIZATION N/A 09/09/2017   Procedure: BARTHOLIN CYST MARSUPIALIZATION;  Surgeon: Will Bonnet, MD;  Location: [facility] ORS;  Service: Gynecology;  Laterality: N/A;  . CERVICAL BIOPSY  W/ LOOP ELECTRODE EXCISION      Gynecologic History: Patient's last menstrual period was 11/15/2017 (approximate).  Obstetric History: [id number]  Family History  Problem Relation Age of Onset  . Colon cancer Mother 54  . Hypertension Mother   . Stroke Mother   . Breast cancer Other 35  . Ovarian cancer Other 40    Social History   Socioeconomic History  . Marital status: Married    Spouse name: Not on file  . Number of children: Not on file  . Years of education: Not on file  . Highest education level: Not on file  Occupational History  . Not on file  Social Needs  . Financial resource strain: Not on file  . Food insecurity:    Worry: Not on file    Inability:  Not on file  . Transportation needs:    Medical: Not on file    Non-medical: Not on file  Tobacco Use  . Smoking status: Former Smoker    Last attempt to quit: 08/08/2017    Years since quitting: 0.3  . Smokeless tobacco: Never Used  Substance and Sexual Activity  . Alcohol use: Yes    Comment: RARE  . Drug use: Yes    Types: Marijuana  . Sexual activity: Not Currently    Birth control/protection: None  Lifestyle  . Physical activity:    Days per week: Not on file    Minutes per session: Not on file  . Stress: Not on file  Relationships  . Social connections:    Talks on phone: Not on file    Gets together: Not on file    Attends religious service: Not on file    Active member of club or organization: Not on file    Attends meetings of clubs or organizations: Not on file    Relationship status: Not on file  . Intimate partner violence:    Fear of current or ex partner: Not on file    Emotionally abused: Not on file    Physically abused: Not on file    Forced sexual activity: Not on file  Other Topics Concern  . Not on file  Social History Narrative  . Not on file  Allergies  Allergen Reactions  . Shellfish Allergy Anaphylaxis, Swelling and Other (See Comments)    Eyes swelling, thickening tongue  . Zithromax [Azithromycin] Anaphylaxis    Medications   Denies  Review of Systems  Constitutional: Negative.   HENT: Negative.   Eyes: Negative.   Respiratory: Negative.   Cardiovascular: Negative.   Gastrointestinal: Negative.   Genitourinary: Negative.   Musculoskeletal: Negative.   Skin: Negative.   Neurological: Negative.   Psychiatric/Behavioral: Negative.     Physical Exam BP 92/62 (BP Location: Left Arm, Patient Position: Sitting, Cuff Size: Normal)   Pulse 79   Ht 5\' 7"  (1.702 m)   Wt 132 lb (59.9 kg)   LMP 11/15/2017 (Approximate)   SpO2 99%   BMI 20.67 kg/m  Patient's last menstrual period was 11/15/2017 (approximate). Physical Exam    Constitutional: She is oriented to person, place, and time. She appears well-developed and well-nourished. No distress.  HENT:  Head: Normocephalic and atraumatic.  Eyes: Conjunctivae are normal. No scleral icterus.  Cardiovascular: Normal rate and regular rhythm. Exam reveals no gallop and no friction rub.  No murmur heard. Pulmonary/Chest: Effort normal and breath sounds normal. No respiratory distress. She has no wheezes. She has no rales.  Abdominal: Soft. Bowel sounds are normal. She exhibits no distension and no mass. There is no tenderness. There is no guarding.  Musculoskeletal: Normal range of motion. She exhibits no edema.  Neurological: She is alert and oriented to person, place, and time. No cranial nerve deficit.  Skin: Skin is warm and dry. No erythema.  Psychiatric: She has a normal mood and affect. Her behavior is normal. Judgment normal.   Assessment: 27 y.o. [id number] female here for  1. Encounter for initial prescription of vaginal ring hormonal contraceptive   2. Missed period      Plan: Problem List Items Addressed This Visit    None    Visit Diagnoses    Encounter for initial prescription of vaginal ring hormonal contraceptive    -  Primary   Relevant Medications   etonogestrel-ethinyl estradiol (NUVARING) 0.12-0.015 MG/24HR vaginal ring   Missed period       Relevant Orders   POCT urine pregnancy (Completed)     Reviewed all forms of birth control options available including abstinence; over the counter/barrier methods; hormonal contraceptive medication including pill, patch, ring, injection,contraceptive implant; hormonal and nonhormonal IUDs; permanent sterilization options including vasectomy and the various tubal sterilization modalities. Risks and benefits reviewed.  She would like to use NuvaRing. Samples given and rx supplied.   20 minutes spent in face to face discussion with > 50% spent in counseling,management, and coordination of care of her  contraceptive management for vaginal ring hormonal contraceptive.   Prentice Docker, MD 12/21/2017 10:22 AM

## 2017-12-21 NOTE — Patient Instructions (Signed)
[place] Phone: [phone] [address] [place], [place] [address] Website: [email or web address]

## 2018-02-07 ENCOUNTER — Ambulatory Visit (INDEPENDENT_AMBULATORY_CARE_PROVIDER_SITE_OTHER): Payer: Medicaid Other | Admitting: Obstetrics and Gynecology

## 2018-02-07 ENCOUNTER — Encounter: Payer: Self-pay | Admitting: Obstetrics and Gynecology

## 2018-02-07 VITALS — BP 118/74 | Ht 67.0 in | Wt 130.0 lb

## 2018-02-07 DIAGNOSIS — N644 Mastodynia: Secondary | ICD-10-CM

## 2018-02-07 NOTE — Progress Notes (Signed)
Obstetrics & Gynecology Office Visit   Chief Complaint  Patient presents with  . Breast Pain   History of Present Illness: 27 y.o. [id number] female who presents for breast pain. The pain started as general tenderness.  The onset of this was one month ago.  She started noticing more intense pain on her left side. She notes throbbing and pulsating pain in her left breat in the upper-outer quadrant.  She took Tylenol and it did not help the pain.  Exercise makes it worse or general movement.  The pain also aches and her aching radiates into her left axilla.  She noted a lump on her breast earlier, but it is not present today. She notes no skin changes on her breast.  She denies discharge and bleeding from her nipples.  She has never had this type of pain before.  The only change in her medical history recently is her change in birth control.   Past Medical History:  Diagnosis Date  . Anxiety and depression   . Cancer ([place])    CERBVICAL  . 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 Complication of anesthesia    WAKES UP IN PANIC / CRYING  . Depression   . HPV (human papilloma virus) infection   . Panic disorder   . Panic disorder   . PTSD (post-traumatic stress disorder)     Past Surgical History:  Procedure Laterality Date  . BARTHOLIN CYST MARSUPIALIZATION N/A 09/09/2017   Procedure: BARTHOLIN CYST MARSUPIALIZATION;  Surgeon: Will Bonnet, MD;  Location: [facility] ORS;  Service: Gynecology;  Laterality: N/A;  . CERVICAL BIOPSY  W/ LOOP ELECTRODE EXCISION      Gynecologic History: Patient's last menstrual period was 02/01/2018.  Obstetric History: [id number]  Family History  Problem Relation Age of Onset  . Colon cancer Mother 84  . Hypertension Mother   . Stroke Mother   . Breast cancer Other 35  . Ovarian cancer Other 40   Social History   Socioeconomic History  . Marital status: Married    Spouse name: Not on file  . Number of  children: Not on file  . Years of education: Not on file  . Highest education level: Not on file  Occupational History  . Not on file  Social Needs  . Financial resource strain: Not on file  . Food insecurity:    Worry: Not on file    Inability: Not on file  . Transportation needs:    Medical: Not on file    Non-medical: Not on file  Tobacco Use  . Smoking status: Former Smoker    Last attempt to quit: 08/08/2017    Years since quitting: 0.5  . Smokeless tobacco: Never Used  Substance and Sexual Activity  . Alcohol use: Yes    Comment: RARE  . Drug use: Yes    Types: Marijuana  . Sexual activity: Not Currently    Birth control/protection: None  Lifestyle  . Physical activity:    Days per week: Not on file    Minutes per session: Not on file  . Stress: Not on file  Relationships  . Social connections:    Talks on phone: Not on file    Gets together: Not on file    Attends religious service: Not on file    Active member of club or organization: Not on file    Attends meetings of clubs or organizations: Not on file    Relationship status:  Not on file  . Intimate partner violence:    Fear of current or ex partner: Not on file    Emotionally abused: Not on file    Physically abused: Not on file    Forced sexual activity: Not on file  Other Topics Concern  . Not on file  Social History Narrative  . Not on file   Allergies  Allergen Reactions  . Shellfish Allergy Anaphylaxis, Swelling and Other (See Comments)    Eyes swelling, thickening tongue  . Zithromax [Azithromycin] Anaphylaxis    Prior to Admission medications   Medication Sig Start Date End Date Taking? Authorizing Provider  acetaminophen (TYLENOL) 500 MG tablet Take 1,000 mg by mouth every 6 (six) hours as needed for moderate pain or headache.   Yes [provider]  etonogestrel-ethinyl estradiol (NUVARING) 0.12-0.015 MG/24HR vaginal ring Insert vaginally and leave in place for 3 consecutive weeks,  then remove for 1 week. 12/21/17  Yes Will Bonnet, MD  ibuprofen (ADVIL,MOTRIN) 600 MG tablet Take 1 tablet (600 mg total) by mouth every 6 (six) hours as needed for mild pain or cramping. 09/09/17  Yes Will Bonnet, MD   Review of Systems  Constitutional: Negative.   HENT: Negative.   Eyes: Negative.   Respiratory: Negative.   Cardiovascular: Negative.   Gastrointestinal: Negative.   Genitourinary: Negative.   Musculoskeletal: Negative.   Skin: Negative.        Breast tenderness  Neurological: Negative.   Psychiatric/Behavioral: Negative.      Physical Exam BP 118/74   Ht 5\' 7"  (1.702 m)   Wt 130 lb (59 kg)   LMP 02/01/2018   BMI 20.36 kg/m  Patient's last menstrual period was 02/01/2018. Physical Exam  Constitutional: She is oriented to person, place, and time. She appears well-developed and well-nourished.  HENT:  Head: Normocephalic and atraumatic.  Eyes: Conjunctivae are normal. No scleral icterus.  Neck: Normal range of motion. No thyromegaly present.  Cardiovascular: Normal rate and regular rhythm.  Pulmonary/Chest: Effort normal and breath sounds normal. No respiratory distress. Right breast exhibits no inverted nipple, no mass, no nipple discharge, no skin change and no tenderness. Left breast exhibits tenderness. Left breast exhibits no inverted nipple, no mass, no nipple discharge and no skin change.    Abdominal: Soft. Bowel sounds are normal. She exhibits no distension.  Musculoskeletal: Normal range of motion. She exhibits no edema.  Lymphadenopathy:    She has no cervical adenopathy.  Neurological: She is alert and oriented to person, place, and time. No cranial nerve deficit.  Skin: Skin is warm and dry. No erythema.  Psychiatric: She has a normal mood and affect. Her behavior is normal. Judgment normal.    Female chaperone present for pelvic and breast  portions of the physical exam  Assessment: 27 y.o. [id number] female here for  1. Mastalgia        Plan: Problem List Items Addressed This Visit    None    Visit Diagnoses    Mastalgia    -  Primary   Relevant Orders   US BREAST LTD UNI LEFT INC AXILLA     No focal abnormalities on exam on either breast.  Focal tenderness in the left upper outer quadrant and at 12:00.  Also from 6:00 to 9:00 areas of focal tenderness were appreciated.  Plan; obtain ultrasound and if necessary mammogram of left breast.  Prentice Docker, MD 02/07/2018 12:58 PM

## 2018-02-08 ENCOUNTER — Other Ambulatory Visit: Payer: Medicaid Other

## 2018-02-14 ENCOUNTER — Ambulatory Visit
Admission: RE | Admit: 2018-02-14 | Discharge: 2018-02-14 | Disposition: A | Discharge status: Home / Self Care | Payer: Medicaid Other | Source: Ambulatory Visit | Attending: Obstetrics and Gynecology | Admitting: Obstetrics and Gynecology

## 2018-02-14 DIAGNOSIS — N644 Mastodynia: Secondary | ICD-10-CM | POA: Diagnosis present

## 2019-04-19 ENCOUNTER — Other Ambulatory Visit: Payer: Self-pay

## 2019-04-19 DIAGNOSIS — Z20822 Contact with and (suspected) exposure to covid-19: Secondary | ICD-10-CM

## 2019-04-21 LAB — NOVEL CORONAVIRUS, NAA: SARS-CoV-2, NAA: NOT DETECTED

## 2019-05-11 NOTE — L&D Delivery Note (Signed)
Date of delivery: 03/18/2020 Estimated Date of Delivery: 03/18/20 Patient's last menstrual period was 05/10/2019 (approximate). EGA: [redacted]w[redacted]d  Delivery Note At 7:54 AM a viable female was delivered via Vaginal, Spontaneous (Presentation: OA, ROA).   APGAR: 7, 8; weight: 3860 g, 8 pounds 8 ounces  Placenta status: Spontaneous, Intact. Trailing membranes removed with ring forceps.  Cord: 3 vessels with the following complications: None.  Cord pH: NA  Patient with bulging bag and complete after AROM of clear fluid. She pushed well to deliver a viable female infant.  The head followed by shoulders, which delivered without difficulty, and the rest of the body.  No nuchal cord noted.  Baby to mom's chest.  Cord clamped and cut after 2 min delay.  Baby taken to warmer in care of nursery team for further transition care. Deep suctioning done with improvement of O2 Sats. Cord blood obtained.  Placenta delivered spontaneously, intact, with a 3-vessel cord.  First degree perineal laceration hemostatic and not requiring repair. Mom and baby in stable condition.  All counts correct.  Hemostasis obtained with IV pitocin and fundal massage.     Anesthesia: Epidural Episiotomy: None Lacerations: 1st degree Suture Repair: NA Est. Blood Loss (mL): 300  Mom to postpartum.  Baby to Couplet care / Skin to Skin.  Rod Can, CNM 03/18/2020, 8:24 AM

## 2019-08-14 ENCOUNTER — Other Ambulatory Visit (HOSPITAL_COMMUNITY)
Admission: RE | Admit: 2019-08-14 | Discharge: 2019-08-14 | Disposition: A | Discharge status: Home / Self Care | Payer: Medicaid Other | Source: Ambulatory Visit | Attending: Obstetrics and Gynecology | Admitting: Obstetrics and Gynecology

## 2019-08-14 ENCOUNTER — Other Ambulatory Visit: Payer: Self-pay

## 2019-08-14 ENCOUNTER — Encounter: Payer: Self-pay | Admitting: Obstetrics and Gynecology

## 2019-08-14 ENCOUNTER — Ambulatory Visit (INDEPENDENT_AMBULATORY_CARE_PROVIDER_SITE_OTHER): Payer: Medicaid Other

## 2019-08-14 ENCOUNTER — Ambulatory Visit (INDEPENDENT_AMBULATORY_CARE_PROVIDER_SITE_OTHER): Payer: Medicaid Other | Admitting: Obstetrics and Gynecology

## 2019-08-14 VITALS — BP 100/60 | Wt 174.0 lb

## 2019-08-14 DIAGNOSIS — Z3A09 9 weeks gestation of pregnancy: Secondary | ICD-10-CM | POA: Diagnosis not present

## 2019-08-14 DIAGNOSIS — Z124 Encounter for screening for malignant neoplasm of cervix: Secondary | ICD-10-CM

## 2019-08-14 DIAGNOSIS — Z3481 Encounter for supervision of other normal pregnancy, first trimester: Secondary | ICD-10-CM

## 2019-08-14 DIAGNOSIS — Z3480 Encounter for supervision of other normal pregnancy, unspecified trimester: Secondary | ICD-10-CM

## 2019-08-14 DIAGNOSIS — Z3689 Encounter for other specified antenatal screening: Secondary | ICD-10-CM

## 2019-08-14 DIAGNOSIS — O3441 Maternal care for other abnormalities of cervix, first trimester: Secondary | ICD-10-CM

## 2019-08-14 DIAGNOSIS — Z9889 Other specified postprocedural states: Secondary | ICD-10-CM | POA: Insufficient documentation

## 2019-08-14 DIAGNOSIS — Z3A13 13 weeks gestation of pregnancy: Secondary | ICD-10-CM | POA: Diagnosis not present

## 2019-08-14 DIAGNOSIS — O0990 Supervision of high risk pregnancy, unspecified, unspecified trimester: Secondary | ICD-10-CM | POA: Insufficient documentation

## 2019-08-14 DIAGNOSIS — O3440 Maternal care for other abnormalities of cervix, unspecified trimester: Secondary | ICD-10-CM | POA: Insufficient documentation

## 2019-08-14 DIAGNOSIS — Z113 Encounter for screening for infections with a predominantly sexual mode of transmission: Secondary | ICD-10-CM
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
## 2019-08-14 NOTE — Patient Instructions (Signed)
First Trimester of Pregnancy The first trimester of pregnancy is from week 1 until the end of week 13 (months 1 through 3). A week after a sperm fertilizes an egg, the egg will implant on the wall of the uterus. This embryo will begin to develop into a baby. Genes from you and your partner will form the baby. The female genes will determine whether the baby will be a boy or a girl. At 6-8 weeks, the eyes and face will be formed, and the heartbeat can be seen on ultrasound. At the end of 12 weeks, all the baby's organs will be formed. Now that you are pregnant, you will want to do everything you can to have a healthy baby. Two of the most important things are to get good prenatal care and to follow your health care provider's instructions. Prenatal care is all the medical care you receive before the baby's birth. This care will help prevent, find, and treat any problems during the pregnancy and childbirth. Body changes during your first trimester Your body goes through many changes during pregnancy. The changes vary from woman to woman.  You may gain or lose a couple of pounds at first.  You may feel sick to your stomach (nauseous) and you may throw up (vomit). If the vomiting is uncontrollable, call your health care provider.  You may tire easily.  You may develop headaches that can be relieved by medicines. All medicines should be approved by your health care provider.  You may urinate more often. Painful urination may mean you have a bladder infection.  You may develop heartburn as a result of your pregnancy.  You may develop constipation because certain hormones are causing the muscles that push stool through your intestines to slow down.  You may develop hemorrhoids or swollen veins (varicose veins).  Your breasts may begin to grow larger and become tender. Your nipples may stick out more, and the tissue that surrounds them (areola) may become darker.  Your gums may bleed and may be  sensitive to brushing and flossing.  Dark spots or blotches (chloasma, mask of pregnancy) may develop on your face. This will likely fade after the baby is born.  Your menstrual periods will stop.  You may have a loss of appetite.  You may develop cravings for certain kinds of food.  You may have changes in your emotions from day to day, such as being excited to be pregnant or being concerned that something may go wrong with the pregnancy and baby.  You may have more vivid and strange dreams.  You may have changes in your hair. These can include thickening of your hair, rapid growth, and changes in texture. Some women also have hair loss during or after pregnancy, or hair that feels dry or thin. Your hair will most likely return to normal after your baby is born. What to expect at prenatal visits During a routine prenatal visit:  You will be weighed to make sure you and the baby are growing normally.  Your blood pressure will be taken.  Your abdomen will be measured to track your baby's growth.  The fetal heartbeat will be listened to between weeks 10 and 14 of your pregnancy.  Test results from any previous visits will be discussed. Your health care provider may ask you:  How you are feeling.  If you are feeling the baby move.  If you have had any abnormal symptoms, such as leaking fluid, bleeding, severe headaches, or abdominal   cramping.  If you are using any tobacco products, including cigarettes, chewing tobacco, and electronic cigarettes.  If you have any questions. Other tests that may be performed during your first trimester include:  Blood tests to find your blood type and to check for the presence of any previous infections. The tests will also be used to check for low iron levels (anemia) and protein on red blood cells (Rh antibodies). Depending on your risk factors, or if you previously had diabetes during pregnancy, you may have tests to check for high blood sugar  that affects pregnant women (gestational diabetes).  Urine tests to check for infections, diabetes, or protein in the urine.  An ultrasound to confirm the proper growth and development of the baby.  Fetal screens for spinal cord problems (spina bifida) and Down syndrome.  HIV (human immunodeficiency virus) testing. Routine prenatal testing includes screening for HIV, unless you choose not to have this test.  You may need other tests to make sure you and the baby are doing well. Follow these instructions at home: Medicines  Follow your health care provider's instructions regarding medicine use. Specific medicines may be either safe or unsafe to take during pregnancy.  Take a prenatal vitamin that contains at least 600 micrograms (mcg) of folic acid.  If you develop constipation, try taking a stool softener if your health care provider approves. Eating and drinking   Eat a balanced diet that includes fresh fruits and vegetables, whole grains, good sources of protein such as meat, eggs, or tofu, and low-fat dairy. Your health care provider will help you determine the amount of weight gain that is right for you.  Avoid raw meat and uncooked cheese. These carry germs that can cause birth defects in the baby.  Eating four or five small meals rather than three large meals a day may help relieve nausea and vomiting. If you start to feel nauseous, eating a few soda crackers can be helpful. Drinking liquids between meals, instead of during meals, also seems to help ease nausea and vomiting.  Limit foods that are high in fat and processed sugars, such as fried and sweet foods.  To prevent constipation: ? Eat foods that are high in fiber, such as fresh fruits and vegetables, whole grains, and beans. ? Drink enough fluid to keep your urine clear or pale yellow. Activity  Exercise only as directed by your health care provider. Most women can continue their usual exercise routine during  pregnancy. Try to exercise for 30 minutes at least 5 days a week. Exercising will help you: ? Control your weight. ? Stay in shape. ? Be prepared for labor and delivery.  Experiencing pain or cramping in the lower abdomen or lower back is a good sign that you should stop exercising. Check with your health care provider before continuing with normal exercises.  Try to avoid standing for long periods of time. Move your legs often if you must stand in one place for a long time.  Avoid heavy lifting.  Wear low-heeled shoes and practice good posture.  You may continue to have sex unless your health care provider tells you not to. Relieving pain and discomfort  Wear a good support bra to relieve breast tenderness.  Take warm sitz baths to soothe any pain or discomfort caused by hemorrhoids. Use hemorrhoid cream if your health care provider approves.  Rest with your legs elevated if you have leg cramps or low back pain.  If you develop varicose veins in   your legs, wear support hose. Elevate your feet for 15 minutes, 3-4 times a day. Limit salt in your diet. Prenatal care  Schedule your prenatal visits by the twelfth week of pregnancy. They are usually scheduled monthly at first, then more often in the last 2 months before delivery.  Write down your questions. Take them to your prenatal visits.  Keep all your prenatal visits as told by your health care provider. This is important. Safety  Wear your seat belt at all times when driving.  Make a list of emergency phone numbers, including numbers for family, friends, the hospital, and police and fire departments. General instructions  Ask your health care provider for a referral to a local prenatal education class. Begin classes no later than the beginning of month 6 of your pregnancy.  Ask for help if you have counseling or nutritional needs during pregnancy. Your health care provider can offer advice or refer you to specialists for help  with various needs.  Do not use hot tubs, steam rooms, or saunas.  Do not douche or use tampons or scented sanitary pads.  Do not cross your legs for long periods of time.  Avoid cat litter boxes and soil used by cats. These carry germs that can cause birth defects in the baby and possibly loss of the fetus by miscarriage or stillbirth.  Avoid all smoking, herbs, alcohol, and medicines not prescribed by your health care provider. Chemicals in these products affect the formation and growth of the baby.  Do not use any products that contain nicotine or tobacco, such as cigarettes and e-cigarettes. If you need help quitting, ask your health care provider. You may receive counseling support and other resources to help you quit.  Schedule a dentist appointment. At home, brush your teeth with a soft toothbrush and be gentle when you floss. Contact a health care provider if:  You have dizziness.  You have mild pelvic cramps, pelvic pressure, or nagging pain in the abdominal area.  You have persistent nausea, vomiting, or diarrhea.  You have a bad smelling vaginal discharge.  You have pain when you urinate.  You notice increased swelling in your face, hands, legs, or ankles.  You are exposed to fifth disease or chickenpox.  You are exposed to German measles (rubella) and have never had it. Get help right away if:  You have a fever.  You are leaking fluid from your vagina.  You have spotting or bleeding from your vagina.  You have severe abdominal cramping or pain.  You have rapid weight gain or loss.  You vomit blood or material that looks like coffee grounds.  You develop a severe headache.  You have shortness of breath.  You have any kind of trauma, such as from a fall or a car accident. Summary  The first trimester of pregnancy is from week 1 until the end of week 13 (months 1 through 3).  Your body goes through many changes during pregnancy. The changes vary from  woman to woman.  You will have routine prenatal visits. During those visits, your health care provider will examine you, discuss any test results you may have, and talk with you about how you are feeling. This information is not intended to replace advice given to you by your health care provider. Make sure you discuss any questions you have with your health care provider. Document Revised: 04/08/2017 Document Reviewed: 04/07/2016 Elsevier Patient Education  2020 Elsevier Inc.  

## 2019-08-14 NOTE — Progress Notes (Signed)
NOB/UPT at [facility] drew Some round ligament pain Flu shot in July  Denies spotting

## 2019-08-15 LAB — RPR+RH+ABO+RUB AB+AB SCR+CB...
Antibody Screen: NEGATIVE
HIV Screen 4th Generation wRfx: NONREACTIVE
Hematocrit: 39.8 % (ref 34.0–46.6)
Hemoglobin: 14.4 g/dL (ref 11.1–15.9)
Hepatitis B Surface Ag: NEGATIVE
MCH: 32.1 pg (ref 26.6–33.0)
MCHC: 36.2 g/dL — ABNORMAL HIGH (ref 31.5–35.7)
MCV: 89 fL (ref 79–97)
Platelets: 334 10*3/uL (ref 150–450)
RBC: 4.48 x10E6/uL (ref 3.77–5.28)
RDW: 12.7 % (ref 11.7–15.4)
RPR Ser Ql: NONREACTIVE
Rh Factor: POSITIVE
Rubella Antibodies, IGG: 4.3 index (ref >0.99)
Varicella zoster IgG: >4000 index (ref >165)
WBC: 11.9 10*3/uL — ABNORMAL HIGH (ref 3.4–10.8)

## 2019-08-16 LAB — URINE CULTURE: Organism ID, Bacteria: NO GROWTH

## 2019-08-17 LAB — CYTOLOGY - PAP
Chlamydia: NEGATIVE
Comment: NEGATIVE
Comment: NEGATIVE
Comment: NEGATIVE
Comment: NORMAL
Diagnosis: NEGATIVE
High risk HPV: NEGATIVE
Neisseria Gonorrhea: NEGATIVE
Trichomonas: NEGATIVE

## 2019-08-20 ENCOUNTER — Telehealth: Payer: Self-pay

## 2019-08-20 NOTE — Telephone Encounter (Signed)
Pt received samples of prenatal vitamins to try. She would like a rx for Citranatal Bloom sent to [email or web address]. cb#[phone] Pt reports she sees Dr. Glennon Mac.

## 2019-08-21 ENCOUNTER — Other Ambulatory Visit: Payer: Self-pay | Admitting: Obstetrics and Gynecology

## 2019-08-21 DIAGNOSIS — Z3480 Encounter for supervision of other normal pregnancy, unspecified trimester: Secondary | ICD-10-CM

## 2019-08-21 LAB — MONITOR DRUG PROFILE 10(MW)
Amphetamine Scrn, Ur: NEGATIVE ng/mL
BARBITURATE SCREEN URINE: NEGATIVE ng/mL
BENZODIAZEPINE SCREEN, URINE: NEGATIVE ng/mL
Cocaine (Metab) Scrn, Ur: NEGATIVE ng/mL
Creatinine(Crt), U: 174.5 mg/dL (ref 20.0–300.0)
Methadone Screen, Urine: NEGATIVE ng/mL
OXYCODONE+OXYMORPHONE UR QL SCN: NEGATIVE ng/mL
Opiate Scrn, Ur: NEGATIVE ng/mL
Ph of Urine: 5.8 (ref 4.5–8.9)
Phencyclidine Qn, Ur: NEGATIVE ng/mL
Propoxyphene Scrn, Ur: NEGATIVE ng/mL

## 2019-08-21 LAB — CANNABINOID (GC/MS), URINE
Cannabinoid: POSITIVE — AB
Carboxy THC (GC/MS): >300 ng/mL

## 2019-08-21 MED ORDER — CITRANATAL BLOOM 90-1 MG PO TABS: 1.0000 | ORAL_TABLET | Freq: Every day | ORAL | 11 refills | Status: DC

## 2019-08-21 NOTE — Telephone Encounter (Signed)
Rx sent 

## 2019-08-22 NOTE — Telephone Encounter (Signed)
Pt aware.

## 2019-08-28 ENCOUNTER — Other Ambulatory Visit: Payer: Self-pay

## 2019-08-28 ENCOUNTER — Ambulatory Visit (INDEPENDENT_AMBULATORY_CARE_PROVIDER_SITE_OTHER): Payer: Medicaid Other | Admitting: Obstetrics and Gynecology

## 2019-08-28 ENCOUNTER — Encounter: Payer: Self-pay | Admitting: Obstetrics and Gynecology

## 2019-08-28 VITALS — BP 118/74 | Wt 179.0 lb

## 2019-08-28 DIAGNOSIS — Z9889 Other specified postprocedural states: Secondary | ICD-10-CM

## 2019-08-28 DIAGNOSIS — Z3481 Encounter for supervision of other normal pregnancy, first trimester: Secondary | ICD-10-CM

## 2019-08-28 DIAGNOSIS — O3441 Maternal care for other abnormalities of cervix, first trimester: Secondary | ICD-10-CM

## 2019-08-28 DIAGNOSIS — Z3A11 11 weeks gestation of pregnancy: Secondary | ICD-10-CM

## 2019-08-28 NOTE — Progress Notes (Signed)
Routine Prenatal Care Visit  Subjective  Anna Woodard is a 29 y.o. [phone] at [redacted]w[redacted]d being seen today for ongoing prenatal care.  She is currently monitored for the following issues for this high-risk pregnancy and has Bartholin's gland abscess; Supervision of other normal pregnancy, antepartum; and History of loop electrosurgical excision procedure (LEEP) of cervix affecting pregnancy, antepartum on their problem list.  ----------------------------------------------------------------------------------- Patient reports no complaints.    . Vag. Bleeding: None.   . Leaking Fluid denies.  ----------------------------------------------------------------------------------- The following portions of the patient's history were reviewed and updated as appropriate: allergies, current medications, past family history, past medical history, past social history, past surgical history and problem list. Problem list updated.  Objective  Blood pressure 118/74, weight 179 lb (81.2 kg), last menstrual period 05/10/2019. Pregravid weight 165 lb (74.8 kg) Total Weight Gain 14 lb (6.35 kg) Urinalysis: Urine Protein    Urine Glucose    Fetal Status: Fetal Heart Rate (bpm): 160         General:  Alert, oriented and cooperative. Patient is in no acute distress.  Skin: Skin is warm and dry. No rash noted.   Cardiovascular: Normal heart rate noted  Respiratory: Normal respiratory effort, no problems with respiration noted  Abdomen: Soft, gravid, appropriate for gestational age. Pain/Pressure: Absent     Pelvic:  Cervical exam deferred        Extremities: Normal range of motion.     Mental Status: Normal mood and affect. Normal behavior. Normal judgment and thought content.   Assessment   29 y.o. [id number] at [redacted]w[redacted]d by  03/18/2020, by Ultrasound presenting for routine prenatal visit  Plan   Pregnancy #4 Problems (from 05/10/19 to present)    Problem Noted Resolved   Supervision of other normal pregnancy,  antepartum 08/14/2019 by Homero Fellers, MD No   Overview Signed 08/14/2019  3:19 PM by Homero Fellers, MD     Nursing Staff Provider  Office Location  [facility] Dating    Language  English Anatomy US    Flu Vaccine   Genetic Screen  NIPS:   AFP:   First Screen:    TDaP vaccine    Hgb A1C or  GTT Early : Third trimester :   Rhogam     LAB RESULTS   Feeding Plan  Blood Type     Contraception  Antibody    Circumcision  Rubella    Pediatrician   RPR     Support Person  HBsAg     Prenatal Classes  HIV      Varicella   BTL Consent  GBS  (For PCN allergy, check sensitivities)        VBAC Consent  Pap      Hgb Electro      CF      SMA               History of loop electrosurgical excision procedure (LEEP) of cervix affecting pregnancy, antepartum 08/14/2019 by Homero Fellers, MD No   Overview Signed 08/14/2019  4:25 PM by Homero Fellers, MD    [ ]  cervical length screening at 16 weeks by TVUS          Preterm labor symptoms and general obstetric precautions including but not limited to vaginal bleeding, contractions, leaking of fluid and fetal movement were reviewed in detail with the patient. Please refer to After Visit Summary for other counseling recommendations.   Return in about 4 weeks (around 09/25/2019) for Routine  Prenatal Appointment.  Prentice Docker, MD, [facility], [facility] 08/28/2019 3:14 PM

## 2019-08-30 NOTE — Telephone Encounter (Signed)
Patient is calling to speak with someone about her prenatal vitamins. Her insurance is not coverage what was sent in. Patient reports she has an allergy to Fish and Soy. Please advise

## 2019-09-04 ENCOUNTER — Other Ambulatory Visit: Payer: Self-pay | Admitting: Obstetrics and Gynecology

## 2019-09-04 DIAGNOSIS — Z3481 Encounter for supervision of other normal pregnancy, first trimester: Secondary | ICD-10-CM

## 2019-09-04 MED ORDER — CONCEPT OB 130-92.4-1 MG PO CAPS
1.0000 | ORAL_CAPSULE | Freq: Every day | ORAL | 11 refills | Status: DC
Start: 2019-09-04 — End: 2019-11-16

## 2019-09-04 NOTE — Telephone Encounter (Signed)
Rx for concept OB prenatal sent.

## 2019-09-04 NOTE — Telephone Encounter (Signed)
Patient is calling to follow up on message left. Would you send something in for this patient since Dr. Glennon Mac isn't in the office. Please advise

## 2019-09-04 NOTE — Telephone Encounter (Signed)
Patient aware Rx sent to Pharmacy. Patient aware to give [place] a call if she has anymore problems in the future.

## 2019-09-25 ENCOUNTER — Encounter: Payer: Self-pay | Admitting: Obstetrics and Gynecology

## 2019-09-25 ENCOUNTER — Ambulatory Visit (INDEPENDENT_AMBULATORY_CARE_PROVIDER_SITE_OTHER): Payer: Medicaid Other | Admitting: Obstetrics and Gynecology

## 2019-09-25 ENCOUNTER — Other Ambulatory Visit: Payer: Self-pay

## 2019-09-25 VITALS — BP 120/70 | Wt 180.0 lb

## 2019-09-25 DIAGNOSIS — Z9889 Other specified postprocedural states: Secondary | ICD-10-CM

## 2019-09-25 DIAGNOSIS — O3442 Maternal care for other abnormalities of cervix, second trimester: Secondary | ICD-10-CM

## 2019-09-25 DIAGNOSIS — Z3A15 15 weeks gestation of pregnancy: Secondary | ICD-10-CM

## 2019-09-25 DIAGNOSIS — O0992 Supervision of high risk pregnancy, unspecified, second trimester: Secondary | ICD-10-CM

## 2019-09-25 NOTE — Progress Notes (Signed)
Routine Prenatal Care Visit  Subjective  Anna Woodard is a 29 y.o. [phone] at [redacted]w[redacted]d being seen today for ongoing prenatal care.  She is currently monitored for the following issues for this high-risk pregnancy and has Bartholin's gland abscess; Supervision of high risk pregnancy, antepartum; and History of loop electrosurgical excision procedure (LEEP) of cervix affecting pregnancy, antepartum on their problem list.  ----------------------------------------------------------------------------------- Patient reports some vaginal/pelvic pressure with certain movements. None currently.    . Vag. Bleeding: None.  Movement: Present. Leaking Fluid denies.  ----------------------------------------------------------------------------------- The following portions of the patient's history were reviewed and updated as appropriate: allergies, current medications, past family history, past medical history, past social history, past surgical history and problem list. Problem list updated.  Objective  Blood pressure 120/70, weight 180 lb (81.6 kg), last menstrual period 05/10/2019. Pregravid weight 165 lb (74.8 kg) Total Weight Gain 15 lb (6.804 kg) Urinalysis: Urine Protein    Urine Glucose    Fetal Status: Fetal Heart Rate (bpm): 150   Movement: Present     General:  Alert, oriented and cooperative. Patient is in no acute distress.  Skin: Skin is warm and dry. No rash noted.   Cardiovascular: Normal heart rate noted  Respiratory: Normal respiratory effort, no problems with respiration noted  Abdomen: Soft, gravid, appropriate for gestational age. Pain/Pressure: Absent     Pelvic:  Cervical exam deferred        Extremities: Normal range of motion.     Mental Status: Normal mood and affect. Normal behavior. Normal judgment and thought content.   Assessment   29 y.o. [id number] at [redacted]w[redacted]d by  03/18/2020, by Ultrasound presenting for routine prenatal visit  Plan   Pregnancy #4 Problems (from 05/10/19 to  present)    Problem Noted Resolved   Supervision of high risk pregnancy, antepartum 08/14/2019 by Homero Fellers, MD No   Overview Addendum 09/25/2019 11:30 AM by Will Bonnet, MD     Nursing Staff Provider  Office Location  [facility] Dating  9 wk u/s  Language  English Anatomy US  [ ]    Flu Vaccine   Genetic Screen  Declines   TDaP vaccine    Hgb A1C or  GTT Early : n/a Third trimester :   Rhogam  n/a   LAB RESULTS   Feeding Plan  Blood Type O/Positive/-- (04/06 1453)   Contraception  Antibody Negative (04/06 1453)  Circumcision  Rubella 4.30 (04/06 1453)  Pediatrician   RPR Non Reactive (04/06 1453)   Support Person  HBsAg Negative (04/06 1453)   Prenatal Classes  HIV Non Reactive (04/06 1453)    Varicella Immune  BTL Consent  GBS  (For PCN allergy, check sensitivities)        VBAC Consent  Pap  NILM, HPV neg    Hgb Electro      CF      SMA               History of loop electrosurgical excision procedure (LEEP) of cervix affecting pregnancy, antepartum 08/14/2019 by Homero Fellers, MD No   Overview Signed 08/14/2019  4:25 PM by Homero Fellers, MD    [ ]  cervical length screening at 16 weeks by TVUS          Preterm labor symptoms and general obstetric precautions including but not limited to vaginal bleeding, contractions, leaking of fluid and fetal movement were reviewed in detail with the patient. Please refer to After Visit Summary for other counseling  recommendations.   - Pelvic exam offered and patient declines at this time. - declines genetic screening.  Return in about 1 week (around 10/02/2019) for 1 week pelvic u/s for cervical length with ROB, 3-4 weeks U/S anatomy and ROB.  Prentice Docker, MD, [facility], [facility] 09/25/2019 11:30 AM

## 2019-09-25 NOTE — Patient Instructions (Signed)

## 2019-10-02 ENCOUNTER — Ambulatory Visit (INDEPENDENT_AMBULATORY_CARE_PROVIDER_SITE_OTHER): Payer: Medicaid Other | Admitting: Obstetrics and Gynecology

## 2019-10-02 ENCOUNTER — Ambulatory Visit (INDEPENDENT_AMBULATORY_CARE_PROVIDER_SITE_OTHER): Payer: Medicaid Other

## 2019-10-02 ENCOUNTER — Encounter: Payer: Self-pay | Admitting: Obstetrics and Gynecology

## 2019-10-02 ENCOUNTER — Other Ambulatory Visit: Payer: Self-pay

## 2019-10-02 VITALS — BP 118/74 | Wt 186.0 lb

## 2019-10-02 DIAGNOSIS — O0992 Supervision of high risk pregnancy, unspecified, second trimester: Secondary | ICD-10-CM

## 2019-10-02 DIAGNOSIS — Z3A16 16 weeks gestation of pregnancy: Secondary | ICD-10-CM

## 2019-10-02 DIAGNOSIS — O3442 Maternal care for other abnormalities of cervix, second trimester: Secondary | ICD-10-CM | POA: Diagnosis not present

## 2019-10-02 DIAGNOSIS — Z9889 Other specified postprocedural states: Secondary | ICD-10-CM

## 2019-10-02 NOTE — Progress Notes (Signed)
Routine Prenatal Care Visit  Subjective  Anna Woodard is a 29 y.o. [phone] at [redacted]w[redacted]d being seen today for ongoing prenatal care.  She is currently monitored for the following issues for this high-risk pregnancy and has Bartholin's gland abscess; Supervision of high risk pregnancy, antepartum; and History of loop electrosurgical excision procedure (LEEP) of cervix affecting pregnancy, antepartum on their problem list.  ----------------------------------------------------------------------------------- Patient reports wheels on her skin at times. None currently, but uses topical steroid when they occur.  Usually the area is small and responds well to the steroid.     . Vag. Bleeding: None.  Movement: Present. Leaking Fluid denies.  Cervical length u/s today is normal at 4.7 cm.  ----------------------------------------------------------------------------------- The following portions of the patient's history were reviewed and updated as appropriate: allergies, current medications, past family history, past medical history, past social history, past surgical history and problem list. Problem list updated.  Objective  Blood pressure 118/74, weight 186 lb (84.4 kg), last menstrual period 05/10/2019. Pregravid weight 165 lb (74.8 kg) Total Weight Gain 21 lb (9.526 kg) Urinalysis: Urine Protein    Urine Glucose    Fetal Status: Fetal Heart Rate (bpm): 148   Movement: Present     General:  Alert, oriented and cooperative. Patient is in no acute distress.  Skin: Skin is warm and dry. No rash noted.   Cardiovascular: Normal heart rate noted  Respiratory: Normal respiratory effort, no problems with respiration noted  Abdomen: Soft, gravid, appropriate for gestational age. Pain/Pressure: Absent     Pelvic:  Cervical exam deferred        Extremities: Normal range of motion.     Mental Status: Normal mood and affect. Normal behavior. Normal judgment and thought content.   Imaging Results US OB  Transvaginal  Result Date: 10/02/2019 Patient Name: Anna Woodard DOB: [birth year] MRN: [id number] ULTRASOUND REPORT Location: [facility] Date of Service: 10/02/2019 Indications: Cervical length Findings: Anna Woodard intrauterine pregnancy is visualized. FHR: 148 BPM Transvaginal cervical length performed. Cervical length measures  4.7 cm in the shortest dimension. There is no change with fundal pressure. No funneling is present. Impression: 1. [redacted]w[redacted]d viable Singleton Intrauterine pregnancy by previously established criteria. 2. Cervical length is 4.7 cm. Gweneth Dimitri, RT The ultrasound images and findings were reviewed by me and I agree with the above report. Prentice Docker, MD, [facility], [facility] 10/02/2019 9:09 AM       Assessment   29 y.o. [id number] at [redacted]w[redacted]d by  03/18/2020, by Ultrasound presenting for routine prenatal visit  Plan   Pregnancy #4 Problems (from 05/10/19 to present)    Problem Noted Resolved   Supervision of high risk pregnancy, antepartum 08/14/2019 by Homero Fellers, MD No   Overview Addendum 09/25/2019 11:30 AM by Will Bonnet, MD     Nursing Staff Provider  Office Location  [facility] Dating  9 wk u/s  Language  English Anatomy US  [ ]    Flu Vaccine   Genetic Screen  Declines   TDaP vaccine    Hgb A1C or  GTT Early : n/a Third trimester :   Rhogam  n/a   LAB RESULTS   Feeding Plan  Blood Type O/Positive/-- (04/06 1453)   Contraception  Antibody Negative (04/06 1453)  Circumcision  Rubella 4.30 (04/06 1453)  Pediatrician   RPR Non Reactive (04/06 1453)   Support Person  HBsAg Negative (04/06 1453)   Prenatal Classes  HIV Non Reactive (04/06 1453)    Varicella  Immune  BTL Consent  GBS  (For PCN allergy, check sensitivities)        VBAC Consent  Pap  NILM, HPV neg    Hgb Electro      CF      SMA               History of loop electrosurgical excision procedure (LEEP) of cervix affecting pregnancy, antepartum 08/14/2019 by  Homero Fellers, MD No   Overview Signed 08/14/2019  4:25 PM by Homero Fellers, MD    [ ]  cervical length screening at 16 weeks by TVUS          Preterm labor symptoms and general obstetric precautions including but not limited to vaginal bleeding, contractions, leaking of fluid and fetal movement were reviewed in detail with the patient. Please refer to After Visit Summary for other counseling recommendations.   Return for Keep previously schedule anatomy ultrsaound and routine prenatal appointment.  Prentice Docker, MD, [facility], [facility] 10/02/2019 9:15 AM

## 2019-10-25 ENCOUNTER — Ambulatory Visit (INDEPENDENT_AMBULATORY_CARE_PROVIDER_SITE_OTHER): Payer: Medicaid Other | Admitting: Obstetrics and Gynecology

## 2019-10-25 ENCOUNTER — Ambulatory Visit (INDEPENDENT_AMBULATORY_CARE_PROVIDER_SITE_OTHER): Payer: Medicaid Other

## 2019-10-25 ENCOUNTER — Other Ambulatory Visit: Payer: Self-pay

## 2019-10-25 ENCOUNTER — Encounter: Payer: Self-pay | Admitting: Obstetrics and Gynecology

## 2019-10-25 VITALS — BP 122/70 | Wt 183.0 lb

## 2019-10-25 DIAGNOSIS — O3503X0 Maternal care for (suspected) central nervous system malformation or damage in fetus, choroid plexus cysts, not applicable or unspecified: Secondary | ICD-10-CM | POA: Insufficient documentation

## 2019-10-25 DIAGNOSIS — O350XX0 Maternal care for (suspected) central nervous system malformation in fetus, not applicable or unspecified: Secondary | ICD-10-CM

## 2019-10-25 DIAGNOSIS — O0992 Supervision of high risk pregnancy, unspecified, second trimester: Secondary | ICD-10-CM

## 2019-10-25 DIAGNOSIS — O3442 Maternal care for other abnormalities of cervix, second trimester: Secondary | ICD-10-CM

## 2019-10-25 DIAGNOSIS — Z3A19 19 weeks gestation of pregnancy: Secondary | ICD-10-CM

## 2019-10-25 DIAGNOSIS — Z9889 Other specified postprocedural states: Secondary | ICD-10-CM

## 2019-10-25 DIAGNOSIS — Z1379 Encounter for other screening for genetic and chromosomal anomalies: Secondary | ICD-10-CM

## 2019-10-25 NOTE — Progress Notes (Signed)
Routine Prenatal Care Visit  Subjective  Anna Woodard is a 29 y.o. [phone] at [redacted]w[redacted]d being seen today for ongoing prenatal care.  She is currently monitored for the following issues for this low-risk pregnancy and has Bartholin's gland abscess; Supervision of high risk pregnancy, antepartum; History of loop electrosurgical excision procedure (LEEP) of cervix affecting pregnancy, antepartum; and Choroid plexus cyst, fetal, affecting care of mother, antepartum, single gestation on their problem list.  ----------------------------------------------------------------------------------- Patient reports no complaints.    . Vag. Bleeding: None.  Movement: Present. Leaking Fluid denies.  Incomplete anatomy u/s. Bilateral choroid plexus cysts ----------------------------------------------------------------------------------- The following portions of the patient's history were reviewed and updated as appropriate: allergies, current medications, past family history, past medical history, past social history, past surgical history and problem list. Problem list updated.  Objective  Blood pressure 122/70, weight 183 lb (83 kg), last menstrual period 05/10/2019. Pregravid weight 165 lb (74.8 kg) Total Weight Gain 18 lb (8.165 kg) Urinalysis: Urine Protein    Urine Glucose    Fetal Status: Fetal Heart Rate (bpm): 144   Movement: Present     General:  Alert, oriented and cooperative. Patient is in no acute distress.  Skin: Skin is warm and dry. No rash noted.   Cardiovascular: Normal heart rate noted  Respiratory: Normal respiratory effort, no problems with respiration noted  Abdomen: Soft, gravid, appropriate for gestational age. Pain/Pressure: Absent     Pelvic:  Cervical exam deferred        Extremities: Normal range of motion.     Mental Status: Normal mood and affect. Normal behavior. Normal judgment and thought content.   Imaging Results US OB Comp + 14 Wk  Result Date: 10/25/2019 Patient  Name: Anna Woodard DOB: 1990/06/22 MRN: [id number] ULTRASOUND REPORT Location: [facility] Date of Service: 10/25/2019 Indications:Anatomy Ultrasound Findings: [name] intrauterine pregnancy is visualized with FHR at 144 BPM. Biometrics give an (U/S) Gestational age of [redacted]w[redacted]d and an (U/S) EDD of 03/17/2020; this correlates with the clinically established Estimated Date of Delivery: 03/18/2020. Fetal presentation is Breech. EFW: 292 g ( 10 oz ). Placenta: posterior. Grade: 1 Marginal cord insertion at the fundus. Unclear whether there is an accessory lobe of the placenta. AFI: subjectively normal. Anatomic survey is incomplete for nose/lips, 4 chamber view not optimally visualized, LVOT not optimally visualized. There are bilateral choroid plexus cysts. The right choroid plexus cyst measures 14.3 x 11.6 x 9.3 mm. The left choroid plexus cyst measures 6.1 x 6.0 x 5.0 mm  Gender - female.  Impression: 1. [redacted]w[redacted]d Viable Singleton Intrauterine pregnancy. 2. (U/S) EDD is consistent with Clinically established Estimated Date of Delivery: 03/18/20 . 3.  There are bilateral choroid plexus cysts, right greater than left. 4. There is a marginal cord insertion into the placenta. Recommendations: 1.Clinical correlation with the patient's History and Physical Exam. 2. Follow up anatomy ultrasound for suboptimal views in 2-4 weeks.  Follow up bilateral choroid plexus cysts at 28 weeks. Consider aneuploidy screening, if not already done. Gweneth Dimitri, RT There is a singleton gestation with subjectively normal amniotic fluid volume. The fetal biometry correlates with established dating. Detailed evaluation of the fetal anatomy was performed.The fetal anatomical survey appears within normal limits within the resolution of ultrasound as described above.  Not all structures were able to be visualized on today's study.  It is recommended that a follow-up ultrasound be performed to complete visualization of the unobserved structures  today.  It must be noted that a normal ultrasound is  unable to rule out fetal aneuploidy nor is it able to detect all possible malformations.    The ultrasound images and findings were reviewed by me and I agree with the above report. Prentice Docker, MD, [facility], [facility] 10/25/2019 2:23 PM       Assessment   29 y.o. [id number] at [redacted]w[redacted]d by  03/18/2020, by Ultrasound presenting for routine prenatal visit  Plan   Pregnancy #4 Problems (from 05/10/19 to present)    Problem Noted Resolved   Choroid plexus cyst, fetal, affecting care of mother, antepartum, single gestation 10/25/2019 by Will Bonnet, MD No   Overview Signed 10/25/2019  2:27 PM by Will Bonnet, MD    Bilateral with 1 > 1 cm [ ]  f/u at 28 weeks [x]  offer aneuploidy screen      Supervision of high risk pregnancy, antepartum 08/14/2019 by Homero Fellers, MD No   Overview Addendum 09/25/2019 11:30 AM by Will Bonnet, MD     Nursing Staff Provider  Office Location  [facility] Dating  9 wk u/s  Language  English Anatomy US  [ ]    Flu Vaccine   Genetic Screen  Declines   TDaP vaccine    Hgb A1C or  GTT Early : n/a Third trimester :   Rhogam  n/a   LAB RESULTS   Feeding Plan  Blood Type O/Positive/-- (04/06 1453)   Contraception  Antibody Negative (04/06 1453)  Circumcision  Rubella 4.30 (04/06 1453)  Pediatrician   RPR Non Reactive (04/06 1453)   Support Person  HBsAg Negative (04/06 1453)   Prenatal Classes  HIV Non Reactive (04/06 1453)    Varicella Immune  BTL Consent  GBS  (For PCN allergy, check sensitivities)        VBAC Consent  Pap  NILM, HPV neg    Hgb Electro      CF      SMA               Previous Version   History of loop electrosurgical excision procedure (LEEP) of cervix affecting pregnancy, antepartum 08/14/2019 by Homero Fellers, MD No   Overview Signed 08/14/2019  4:25 PM by Homero Fellers, MD    [ ]  cervical length screening at 16 weeks by  TVUS          Preterm labor symptoms and general obstetric precautions including but not limited to vaginal bleeding, contractions, leaking of fluid and fetal movement were reviewed in detail with the patient. Please refer to After Visit Summary for other counseling recommendations.   - NIPT today   Return in about 2 weeks (around 11/08/2019) for U/S for completion anatomy and routine prenatal.  Prentice Docker, MD, [facility], [facility] 10/25/2019 2:47 PM

## 2019-10-30 LAB — MATERNIT 21 PLUS CORE, BLOOD
Fetal Fraction: 10
Result (T21): NEGATIVE
Trisomy 13 (Patau syndrome): NEGATIVE
Trisomy 18 (Edwards syndrome): NEGATIVE
Trisomy 21 (Down syndrome): NEGATIVE

## 2019-11-06 ENCOUNTER — Telehealth: Payer: Self-pay

## 2019-11-06 NOTE — Telephone Encounter (Signed)
Can you let pt know labs are normal? If she wants to know the sex its a girl. Thank you!

## 2019-11-06 NOTE — Telephone Encounter (Signed)
Pt calling to speak with Dr Glennon Mac about the Mat21 results, she aware SDJ is not in the office today,

## 2019-11-06 NOTE — Telephone Encounter (Signed)
Pt aware.

## 2019-11-13 ENCOUNTER — Ambulatory Visit: Payer: Medicaid Other

## 2019-11-13 ENCOUNTER — Ambulatory Visit (INDEPENDENT_AMBULATORY_CARE_PROVIDER_SITE_OTHER): Payer: Medicaid Other

## 2019-11-13 ENCOUNTER — Other Ambulatory Visit: Payer: Self-pay

## 2019-11-13 ENCOUNTER — Encounter: Payer: Medicaid Other | Admitting: Obstetrics and Gynecology

## 2019-11-13 DIAGNOSIS — O0992 Supervision of high risk pregnancy, unspecified, second trimester: Secondary | ICD-10-CM

## 2019-11-16 ENCOUNTER — Other Ambulatory Visit: Payer: Self-pay

## 2019-11-16 ENCOUNTER — Ambulatory Visit (INDEPENDENT_AMBULATORY_CARE_PROVIDER_SITE_OTHER): Payer: Medicaid Other | Admitting: Obstetrics

## 2019-11-16 VITALS — BP 114/62 | Wt 190.0 lb

## 2019-11-16 DIAGNOSIS — O0992 Supervision of high risk pregnancy, unspecified, second trimester: Secondary | ICD-10-CM

## 2019-11-16 DIAGNOSIS — Z3A22 22 weeks gestation of pregnancy: Secondary | ICD-10-CM

## 2019-11-16 LAB — POCT URINALYSIS DIPSTICK OB
Glucose, UA: NEGATIVE
POC,PROTEIN,UA: NEGATIVE

## 2019-11-16 NOTE — Progress Notes (Signed)
ROB Anatomy scan 7/6

## 2019-11-16 NOTE — Addendum Note (Signed)
Addended by: [place], Nessa Ramaker B on: 11/16/2019 10:45 AM   Modules accepted: Orders

## 2019-11-16 NOTE — Progress Notes (Signed)
Routine Prenatal Care Visit   Subjective  Anna Woodard is a 29 y.o. [phone] at [redacted]w[redacted]d being seen today for ongoing prenatal care.  She is currently monitored for the following issues for this low-risk pregnancy and has Bartholin's gland abscess; Supervision of high risk pregnancy, antepartum; History of loop electrosurgical excision procedure (LEEP) of cervix affecting pregnancy, antepartum; and Choroid plexus cyst, fetal, affecting care of mother, antepartum, single gestation on their problem list.  ----------------------------------------------------------------------------------- Patient reports she is having ongoing issues with Carpal tunnel syndrome. Wearing a wrist splint today.   .  .   [name] Fluid denies.  ----------------------------------------------------------------------------------- The following portions of the patient's history were reviewed and updated as appropriate: allergies, current medications, past family history, past medical history, past social history, past surgical history and problem list. Problem list updated.  Objective  Blood pressure 114/62, weight 190 lb (86.2 kg), last menstrual period 05/10/2019. Pregravid weight 165 lb (74.8 kg) Total Weight Gain 25 lb (11.3 kg) Urinalysis: Urine Protein    Urine Glucose    Fetal Status:           General:  Alert, oriented and cooperative. Patient is in no acute distress.  Skin: Skin is warm and dry. No rash noted.   Cardiovascular: Normal heart rate noted  Respiratory: Normal respiratory effort, no problems with respiration noted  Abdomen: Soft, gravid, appropriate for gestational age.       Pelvic:  Cervical exam deferred        Extremities: Normal range of motion.     Mental Status: Normal mood and affect. Normal behavior. Normal judgment and thought content.   Assessment   29 y.o. [id number] at [redacted]w[redacted]d by  03/18/2020, by Ultrasound presenting for routine prenatal visit Carpal tunnel.  Plan   Pregnancy #4  Problems (from 05/10/19 to present)    Problem Noted Resolved   Choroid plexus cyst, fetal, affecting care of mother, antepartum, single gestation 10/25/2019 by Will Bonnet, MD No   Overview Signed 10/25/2019  2:27 PM by Will Bonnet, MD    Bilateral with 1 > 1 cm [ ]  f/u at 28 weeks [x]  offer aneuploidy screen      Supervision of high risk pregnancy, antepartum 08/14/2019 by Homero Fellers, MD No   Overview Addendum 09/25/2019 11:30 AM by Will Bonnet, MD     Nursing Staff Provider  Office Location  [facility] Dating  9 wk u/s  Language  English Anatomy US  [ ]    Flu Vaccine   Genetic Screen  Declines   TDaP vaccine    Hgb A1C or  GTT Early : n/a Third trimester :   Rhogam  n/a   LAB RESULTS   Feeding Plan  Blood Type O/Positive/-- (04/06 1453)   Contraception  Antibody Negative (04/06 1453)  Circumcision  Rubella 4.30 (04/06 1453)  Pediatrician   RPR Non Reactive (04/06 1453)   Support Person  HBsAg Negative (04/06 1453)   Prenatal Classes  HIV Non Reactive (04/06 1453)    Varicella Immune  BTL Consent  GBS  (For PCN allergy, check sensitivities)        VBAC Consent  Pap  NILM, HPV neg    Hgb Electro      CF      SMA               Previous Version   History of loop electrosurgical excision procedure (LEEP) of cervix affecting pregnancy, antepartum 08/14/2019 by Gilman Schmidt,  Stefanie Libel, MD No   Overview Signed 08/14/2019  4:25 PM by Homero Fellers, MD    [ ]  cervical length screening at 16 weeks by TVUS          Preterm labor symptoms and general obstetric precautions including but not limited to vaginal bleeding, contractions, leaking of fluid and fetal movement were reviewed in detail with the patient. Please refer to After Visit Summary for other counseling recommendations.  We discussed her ongoing struggle with carpel tunnel. She is wearing a wrist splint. Suggested starting with chiro and massage, avoiding salty foods to reduce any edema,  use Tylenol, and then if comfortable, try accupuncture  Return in about 4 weeks (around 12/14/2019) for return OB.  Imagene Riches, CNM  11/16/2019 10:17 AM

## 2019-12-21 ENCOUNTER — Other Ambulatory Visit: Payer: Self-pay

## 2019-12-21 ENCOUNTER — Ambulatory Visit (INDEPENDENT_AMBULATORY_CARE_PROVIDER_SITE_OTHER): Payer: Medicaid Other | Admitting: Obstetrics

## 2019-12-21 VITALS — BP 120/70 | Wt 194.0 lb

## 2019-12-21 DIAGNOSIS — O0992 Supervision of high risk pregnancy, unspecified, second trimester: Secondary | ICD-10-CM

## 2019-12-21 DIAGNOSIS — Z3480 Encounter for supervision of other normal pregnancy, unspecified trimester: Secondary | ICD-10-CM

## 2019-12-21 DIAGNOSIS — O0990 Supervision of high risk pregnancy, unspecified, unspecified trimester: Secondary | ICD-10-CM

## 2019-12-21 DIAGNOSIS — Z3A27 27 weeks gestation of pregnancy: Secondary | ICD-10-CM

## 2019-12-21 NOTE — Progress Notes (Signed)
Routine Prenatal Care Visit  Subjective  Anna Woodard is a 29 y.o. [phone] at [redacted]w[redacted]d being seen today for ongoing prenatal care.  She is currently monitored for the following issues for this low-risk pregnancy and has Bartholin's gland abscess; Supervision of high risk pregnancy, antepartum; History of loop electrosurgical excision procedure (LEEP) of cervix affecting pregnancy, antepartum; and Choroid plexus cyst, fetal, affecting care of mother, antepartum, single gestation on their problem list.  ----------------------------------------------------------------------------------- Patient reports no complaints.  Her carpal tunnel is better. She has used wrist splints. Contractions: Not present. Vag. Bleeding: None.  Movement: Present. Leaking Fluid denies.  ----------------------------------------------------------------------------------- The following portions of the patient's history were reviewed and updated as appropriate: allergies, current medications, past family history, past medical history, past social history, past surgical history and problem list. Problem list updated.  Objective  Blood pressure 120/70, weight 194 lb (88 kg), last menstrual period 05/10/2019. Pregravid weight 165 lb (74.8 kg) Total Weight Gain 29 lb (13.2 kg) Urinalysis: Urine Protein    Urine Glucose    Fetal Status:     Movement: Present     General:  Alert, oriented and cooperative. Patient is in no acute distress.  Skin: Skin is warm and dry. No rash noted.   Cardiovascular: Normal heart rate noted  Respiratory: Normal respiratory effort, no problems with respiration noted  Abdomen: Soft, gravid, appropriate for gestational age. Pain/Pressure: Absent     Pelvic:  Cervical exam deferred        Extremities: Normal range of motion.     Mental Status: Normal mood and affect. Normal behavior. Normal judgment and thought content.   Assessment   29 y.o. [id number] at [redacted]w[redacted]d by  03/18/2020, by Ultrasound  presenting for routine prenatal visit  Plan   Pregnancy #4 Problems (from 05/10/19 to present)    Problem Noted Resolved   Choroid plexus cyst, fetal, affecting care of mother, antepartum, single gestation 10/25/2019 by Will Bonnet, MD No   Overview Signed 10/25/2019  2:27 PM by Will Bonnet, MD    Bilateral with 1 > 1 cm [ ]  f/u at 28 weeks [x]  offer aneuploidy screen      Supervision of high risk pregnancy, antepartum 08/14/2019 by Homero Fellers, MD No   Overview Addendum 09/25/2019 11:30 AM by Will Bonnet, MD     Nursing Staff Provider  Office Location  [facility] Dating  9 wk u/s  Language  English Anatomy US  [ ]    Flu Vaccine   Genetic Screen  Declines   TDaP vaccine    Hgb A1C or  GTT Early : n/a Third trimester :   Rhogam  n/a   LAB RESULTS   Feeding Plan  Blood Type O/Positive/-- (04/06 1453)   Contraception  Antibody Negative (04/06 1453)  Circumcision  Rubella 4.30 (04/06 1453)  Pediatrician   RPR Non Reactive (04/06 1453)   Support Person  HBsAg Negative (04/06 1453)   Prenatal Classes  HIV Non Reactive (04/06 1453)    Varicella Immune  BTL Consent  GBS  (For PCN allergy, check sensitivities)        VBAC Consent  Pap  NILM, HPV neg    Hgb Electro      CF      SMA               Previous Version   History of loop electrosurgical excision procedure (LEEP) of cervix affecting pregnancy, antepartum 08/14/2019 by Homero Fellers, MD No  Overview Signed 08/14/2019  4:25 PM by Homero Fellers, MD    [ ]  cervical length screening at 16 weeks by TVUS          Preterm labor symptoms and general obstetric precautions including but not limited to vaginal bleeding, contractions, leaking of fluid and fetal movement were reviewed in detail with the patient. Please refer to After Visit Summary for other counseling recommendations.  Discussed her labwork for the next visit.  Return in about 1 week (around 12/28/2019) for return OB and 1hr  GTT, labs.  Imagene Riches, CNM  12/21/2019 10:31 AM

## 2020-01-02 ENCOUNTER — Other Ambulatory Visit: Payer: Medicaid Other

## 2020-01-02 ENCOUNTER — Other Ambulatory Visit: Payer: Self-pay

## 2020-01-02 ENCOUNTER — Ambulatory Visit (INDEPENDENT_AMBULATORY_CARE_PROVIDER_SITE_OTHER): Payer: Medicaid Other | Admitting: Obstetrics

## 2020-01-02 VITALS — BP 130/70 | Ht 67.0 in | Wt 198.0 lb

## 2020-01-02 DIAGNOSIS — Z3480 Encounter for supervision of other normal pregnancy, unspecified trimester: Secondary | ICD-10-CM

## 2020-01-02 DIAGNOSIS — O0990 Supervision of high risk pregnancy, unspecified, unspecified trimester: Secondary | ICD-10-CM

## 2020-01-02 DIAGNOSIS — O0993 Supervision of high risk pregnancy, unspecified, third trimester: Secondary | ICD-10-CM

## 2020-01-02 DIAGNOSIS — Z3A29 29 weeks gestation of pregnancy: Secondary | ICD-10-CM

## 2020-01-02 LAB — POCT URINALYSIS DIPSTICK OB
Glucose, UA: NEGATIVE
POC,PROTEIN,UA: NEGATIVE

## 2020-01-02 NOTE — Progress Notes (Signed)
Routine Prenatal Care Visit  Subjective  Anna Woodard is a 29 y.o. [phone] at [redacted]w[redacted]d being seen today for ongoing prenatal care.  She is currently monitored for the following issues for this low-risk pregnancy and has Bartholin's gland abscess; Supervision of high risk pregnancy, antepartum; History of loop electrosurgical excision procedure (LEEP) of cervix affecting pregnancy, antepartum; and Choroid plexus cyst, fetal, affecting care of mother, antepartum, single gestation on their problem list.   ----------------------------------------------------------------------------------- Patient reports no complaints.   Contractions: Not present.  .  Movement: Present. Leaking Fluid denies.  ----------------------------------------------------------------------------------- The following portions of the patient's history were reviewed and updated as appropriate: allergies, current medications, past family history, past medical history, past social history, past surgical history and problem list. Problem list updated.  Objective  Blood pressure 130/70, height 5\' 7"  (1.702 m), weight 198 lb (89.8 kg), last menstrual period 05/10/2019. Pregravid weight 165 lb (74.8 kg) Total Weight Gain 33 lb (15 kg) Urinalysis: Urine Protein    Urine Glucose    Fetal Status:     Movement: Present     General:  Alert, oriented and cooperative. Patient is in no acute distress.  Skin: Skin is warm and dry. No rash noted.   Cardiovascular: Normal heart rate noted  Respiratory: Normal respiratory effort, no problems with respiration noted  Abdomen: Soft, gravid, appropriate for gestational age. Pain/Pressure: Absent     Pelvic:  Cervical exam deferred        Extremities: Normal range of motion.     Mental Status: Normal mood and affect. Normal behavior. Normal judgment and thought content.   Assessment   29 y.o. [id number] at [redacted]w[redacted]d by  03/18/2020, by Ultrasound presenting for routine prenatal visit  Plan    Pregnancy #4 Problems (from 05/10/19 to present)    Problem Noted Resolved   Choroid plexus cyst, fetal, affecting care of mother, antepartum, single gestation 10/25/2019 by Will Bonnet, MD No   Overview Signed 10/25/2019  2:27 PM by Will Bonnet, MD    Bilateral with 1 > 1 cm [ ]  f/u at 28 weeks [x]  offer aneuploidy screen      Supervision of high risk pregnancy, antepartum 08/14/2019 by Homero Fellers, MD No   Overview Addendum 01/02/2020 10:31 AM by Imagene Riches, CNM     Nursing Staff Provider  Office Location  [facility] Dating  9 wk u/s  Language  English Anatomy US  [ ]    Flu Vaccine   Genetic Screen  Declines   TDaP vaccine    Hgb A1C or  GTT Early : n/a Third trimester : 8/25  Rhogam  n/a   LAB RESULTS   Feeding Plan  Breast Blood Type O/Positive/-- (04/06 1453)   Contraception  Antibody Negative (04/06 1453)  Circumcision  na Rubella 4.30 (04/06 1453)  Pediatrician   RPR Non Reactive (04/06 1453)   Support Person  HBsAg Negative (04/06 1453)   Prenatal Classes  HIV Non Reactive (04/06 1453)    Varicella Immune  BTL Consent  GBS  (For PCN allergy, check sensitivities)        VBAC Consent  Pap  NILM, HPV neg    Hgb Electro      CF      SMA               Previous Version   History of loop electrosurgical excision procedure (LEEP) of cervix affecting pregnancy, antepartum 08/14/2019 by Homero Fellers, MD No   Overview  Signed 08/14/2019  4:25 PM by Homero Fellers, MD    [ ]  cervical length screening at 16 weeks by TVUS          Preterm labor symptoms and general obstetric precautions including but not limited to vaginal bleeding, contractions, leaking of fluid and fetal movement were reviewed in detail with the patient. Please refer to After Visit Summary for other counseling recommendations.  1hr GTT and labs drawn today.  Return in about 2 weeks (around 01/16/2020) for return OB.  Imagene Riches, CNM  01/02/2020 10:33 AM

## 2020-01-03 LAB — 28 WEEK RH+PANEL
Basophils Absolute: 0 10*3/uL (ref 0.0–0.2)
Basos: 0 %
EOS (ABSOLUTE): 0.1 10*3/uL (ref 0.0–0.4)
Eos: 1 %
Gestational Diabetes Screen: 139 mg/dL (ref 65–139)
HIV Screen 4th Generation wRfx: NONREACTIVE
Hematocrit: 36.8 % (ref 34.0–46.6)
Hemoglobin: 12.9 g/dL (ref 11.1–15.9)
Immature Grans (Abs): 0.1 10*3/uL (ref 0.0–0.1)
Immature Granulocytes: 1 %
Lymphocytes Absolute: 2.2 10*3/uL (ref 0.7–3.1)
Lymphs: 18 %
MCH: 31.5 pg (ref 26.6–33.0)
MCHC: 35.1 g/dL (ref 31.5–35.7)
MCV: 90 fL (ref 79–97)
Monocytes Absolute: 0.8 10*3/uL (ref 0.1–0.9)
Monocytes: 6 %
Neutrophils Absolute: 9.2 10*3/uL — ABNORMAL HIGH (ref 1.4–7.0)
Neutrophils: 74 %
Platelets: 381 10*3/uL (ref 150–450)
RBC: 4.1 x10E6/uL (ref 3.77–5.28)
RDW: 12.7 % (ref 11.7–15.4)
RPR Ser Ql: NONREACTIVE
WBC: 12.5 10*3/uL — ABNORMAL HIGH (ref 3.4–10.8)

## 2020-01-18 ENCOUNTER — Other Ambulatory Visit: Payer: Self-pay

## 2020-01-18 ENCOUNTER — Ambulatory Visit (INDEPENDENT_AMBULATORY_CARE_PROVIDER_SITE_OTHER): Payer: Medicaid Other | Admitting: Obstetrics

## 2020-01-18 ENCOUNTER — Encounter: Payer: Self-pay | Admitting: Obstetrics

## 2020-01-18 VITALS — BP 120/70 | Wt 193.0 lb

## 2020-01-18 DIAGNOSIS — O26893 Other specified pregnancy related conditions, third trimester: Secondary | ICD-10-CM

## 2020-01-18 DIAGNOSIS — Z3A31 31 weeks gestation of pregnancy: Secondary | ICD-10-CM

## 2020-01-18 DIAGNOSIS — Z3480 Encounter for supervision of other normal pregnancy, unspecified trimester: Secondary | ICD-10-CM

## 2020-01-18 DIAGNOSIS — N898 Other specified noninflammatory disorders of vagina: Secondary | ICD-10-CM

## 2020-01-18 LAB — POCT URINALYSIS DIPSTICK OB
Glucose, UA: NEGATIVE
POC,PROTEIN,UA: NEGATIVE

## 2020-01-18 NOTE — Progress Notes (Signed)
C/o pelvic pain; B-H; d/c is now yellow;

## 2020-01-18 NOTE — Progress Notes (Signed)
Routine Prenatal Care Visit  Subjective  Anna Woodard is a 29 y.o. [phone] at [redacted]w[redacted]d being seen today for ongoing prenatal care.  She is currently monitored for the following issues for this high-risk pregnancy and has Bartholin's gland abscess; Supervision of high risk pregnancy, antepartum; History of loop electrosurgical excision procedure (LEEP) of cervix affecting pregnancy, antepartum; and Choroid plexus cyst, fetal, affecting care of mother, antepartum, single gestation on their problem list.  ----------------------------------------------------------------------------------- Patient reports no bleeding, no contractions, no cramping and but she has noticed a whit to yellos discarge. It is not itchy, and she denies any burning or malodor. Asking for this to be evalauted..    .  .   Anna Woodard Fluid reports a yellowish discharge.  ----------------------------------------------------------------------------------- The following portions of the patient's history were reviewed and updated as appropriate: allergies, current medications, past family history, past medical history, past social history, past surgical history and problem list. Problem list updated.  Objective  Blood pressure 120/70, weight 193 lb (87.5 kg), last menstrual period 05/10/2019. Pregravid weight 165 lb (74.8 kg) Total Weight Gain 28 lb (12.7 kg) Urinalysis: Urine Protein    Urine Glucose    Fetal Status:           General:  Alert, oriented and cooperative. Patient is in no acute distress.  Skin: Skin is warm and dry. No rash noted.   Cardiovascular: Normal heart rate noted  Respiratory: Normal respiratory effort, no problems with respiration noted  Abdomen: Soft, gravid, appropriate for gestational age.       Pelvic:  Cervical exam deferred        Extremities: Normal range of motion.     Mental Status: Normal mood and affect. Normal behavior. Normal judgment and thought content.   Assessment   29 y.o. [id number] at  [redacted]w[redacted]d by  03/18/2020, by Ultrasound presenting for routine prenatal visit  Plan   Pregnancy #4 Problems (from 05/10/19 to present)    Problem Noted Resolved   Choroid plexus cyst, fetal, affecting care of mother, antepartum, single gestation 10/25/2019 by Will Bonnet, MD No   Overview Signed 10/25/2019  2:27 PM by Will Bonnet, MD    Bilateral with 1 > 1 cm [ ]  f/u at 28 weeks [x]  offer aneuploidy screen      Supervision of high risk pregnancy, antepartum 08/14/2019 by Homero Fellers, MD No   Overview Addendum 01/03/2020 11:33 AM by Imagene Riches, CNM     Nursing Staff Provider  Office Location  [facility] Dating  9 wk u/s  Language  English Anatomy US  [ ]    Flu Vaccine   Genetic Screen  Declines   TDaP vaccine    Hgb A1C or  GTT Early : n/a Third trimester : 8/25  139  Rhogam  n/a   LAB RESULTS   Feeding Plan  Breast Blood Type O/Positive/-- (04/06 1453)   Contraception  Antibody Negative (04/06 1453)  Circumcision  na Rubella 4.30 (04/06 1453)  Pediatrician   RPR Non Reactive (04/06 1453)   Support Person  HBsAg Negative (04/06 1453)   Prenatal Classes  HIV Non Reactive (04/06 1453)    Varicella Immune  BTL Consent  GBS  (For PCN allergy, check sensitivities)        VBAC Consent  Pap  NILM, HPV neg    Hgb Electro      CF      SMA  Previous Version   History of loop electrosurgical excision procedure (LEEP) of cervix affecting pregnancy, antepartum 08/14/2019 by Homero Fellers, MD No   Overview Signed 08/14/2019  4:25 PM by Homero Fellers, MD    [ ]  cervical length screening at 16 weeks by [facility]        spec exam: moderate white to yellowish discharge noted. No odor.  Preterm labor symptoms and general obstetric precautions including but not limited to vaginal bleeding, contractions, leaking of fluid and fetal movement were reviewed in detail with the patient. Please refer to After Visit Summary for other counseling  recommendations.   A Nuswab is sent for evaluation today. We will contact her with the results.   Return in about 2 weeks (around 02/01/2020) for return OB.  Imagene Riches, CNM  01/18/2020 11:01 AM

## 2020-01-21 LAB — NUSWAB VAGINITIS (VG)
Candida albicans, NAA: NEGATIVE
Candida glabrata, NAA: NEGATIVE
Trich vag by NAA: NEGATIVE

## 2020-02-04 ENCOUNTER — Encounter: Payer: Self-pay | Admitting: Obstetrics and Gynecology

## 2020-02-04 ENCOUNTER — Ambulatory Visit (INDEPENDENT_AMBULATORY_CARE_PROVIDER_SITE_OTHER): Payer: Medicaid Other | Admitting: Obstetrics and Gynecology

## 2020-02-04 ENCOUNTER — Other Ambulatory Visit: Payer: Self-pay

## 2020-02-04 VITALS — BP 126/74 | Wt 196.0 lb

## 2020-02-04 DIAGNOSIS — O0993 Supervision of high risk pregnancy, unspecified, third trimester: Secondary | ICD-10-CM

## 2020-02-04 DIAGNOSIS — Z9889 Other specified postprocedural states: Secondary | ICD-10-CM

## 2020-02-04 DIAGNOSIS — O3443 Maternal care for other abnormalities of cervix, third trimester: Secondary | ICD-10-CM

## 2020-02-04 DIAGNOSIS — Z23 Encounter for immunization: Secondary | ICD-10-CM | POA: Diagnosis not present

## 2020-02-04 DIAGNOSIS — Z3A33 33 weeks gestation of pregnancy: Secondary | ICD-10-CM

## 2020-02-04 NOTE — Addendum Note (Signed)
Addended by: Brien Few on: 02/04/2020 10:11 AM   Modules accepted: Orders

## 2020-02-04 NOTE — Progress Notes (Signed)
Routine Prenatal Care Visit  Subjective  Anna Woodard is a 29 y.o. [phone] at [redacted]w[redacted]d being seen today for ongoing prenatal care.  She is currently monitored for the following issues for this low-risk pregnancy and has Bartholin's gland abscess; Supervision of high risk pregnancy, antepartum; History of loop electrosurgical excision procedure (LEEP) of cervix affecting pregnancy, antepartum; and Choroid plexus cyst, fetal, affecting care of mother, antepartum, single gestation on their problem list.  ----------------------------------------------------------------------------------- Patient reports no complaints.   Contractions: Not present. Vag. Bleeding: None.  Movement: Present. Leaking Fluid denies.  ----------------------------------------------------------------------------------- The following portions of the patient's history were reviewed and updated as appropriate: allergies, current medications, past family history, past medical history, past social history, past surgical history and problem list. Problem list updated.  Objective  Blood pressure 126/74, weight 196 lb (88.9 kg), last menstrual period 05/10/2019. Pregravid weight 165 lb (74.8 kg) Total Weight Gain 31 lb (14.1 kg) Urinalysis: Urine Protein    Urine Glucose    Fetal Status: Fetal Heart Rate (bpm): 145 Fundal Height: 34 cm Movement: Present     General:  Alert, oriented and cooperative. Patient is in no acute distress.  Skin: Skin is warm and dry. No rash noted.   Cardiovascular: Normal heart rate noted  Respiratory: Normal respiratory effort, no problems with respiration noted  Abdomen: Soft, gravid, appropriate for gestational age. Pain/Pressure: Absent     Pelvic:  Cervical exam deferred        Extremities: Normal range of motion.  Edema: None  Mental Status: Normal mood and affect. Normal behavior. Normal judgment and thought content.   Assessment   29 y.o. [id number] at 109w6d by  03/18/2020, by Ultrasound  presenting for routine prenatal visit  Plan   Pregnancy #4 Problems (from 05/10/19 to present)    Problem Noted Resolved   Choroid plexus cyst, fetal, affecting care of mother, antepartum, single gestation 10/25/2019 by Will Bonnet, MD No   Overview Signed 10/25/2019  2:27 PM by Will Bonnet, MD    Bilateral with 1 > 1 cm [ ]  f/u at 28 weeks [x]  offer aneuploidy screen      Supervision of high risk pregnancy, antepartum 08/14/2019 by Homero Fellers, MD No   Overview Addendum 01/03/2020 11:33 AM by Imagene Riches, CNM     Nursing Staff Provider  Office Location  [facility] Dating  9 wk u/s  Language  English Anatomy US  [ ]    Flu Vaccine   Genetic Screen  Declines   TDaP vaccine    Hgb A1C or  GTT Early : n/a Third trimester : 8/25  139  Rhogam  n/a   LAB RESULTS   Feeding Plan  Breast Blood Type O/Positive/-- (04/06 1453)   Contraception  Antibody Negative (04/06 1453)  Circumcision  na Rubella 4.30 (04/06 1453)  Pediatrician   RPR Non Reactive (04/06 1453)   Support Person  HBsAg Negative (04/06 1453)   Prenatal Classes  HIV Non Reactive (04/06 1453)    Varicella Immune  BTL Consent  GBS  (For PCN allergy, check sensitivities)        VBAC Consent  Pap  NILM, HPV neg    Hgb Electro      CF      SMA               Previous Version   History of loop electrosurgical excision procedure (LEEP) of cervix affecting pregnancy, antepartum 08/14/2019 by Homero Fellers, MD No  Overview Signed 08/14/2019  4:25 PM by Homero Fellers, MD    [ ]  cervical length screening at 16 weeks by [facility]          Preterm labor symptoms and general obstetric precautions including but not limited to vaginal bleeding, contractions, leaking of fluid and fetal movement were reviewed in detail with the patient. Please refer to After Visit Summary for other counseling recommendations.   Return in about 2 weeks (around 02/18/2020) for Routine Prenatal  Appointment.  Prentice Docker, MD, [facility], [facility] 02/04/2020 8:49 AM

## 2020-02-18 ENCOUNTER — Observation Stay
Admission: EM | Admit: 2020-02-18 | Discharge: 2020-02-18 | Disposition: A | Discharge status: Home / Self Care | Payer: Medicaid Other | Attending: Advanced Practice Midwife | Admitting: Advanced Practice Midwife

## 2020-02-18 ENCOUNTER — Encounter: Payer: Self-pay | Admitting: Obstetrics and Gynecology

## 2020-02-18 ENCOUNTER — Other Ambulatory Visit: Payer: Self-pay

## 2020-02-18 ENCOUNTER — Ambulatory Visit (INDEPENDENT_AMBULATORY_CARE_PROVIDER_SITE_OTHER): Payer: Medicaid Other | Admitting: Obstetrics

## 2020-02-18 VITALS — BP 120/60 | Wt 196.0 lb

## 2020-02-18 DIAGNOSIS — M549 Dorsalgia, unspecified: Secondary | ICD-10-CM | POA: Diagnosis not present

## 2020-02-18 DIAGNOSIS — O3503X0 Maternal care for (suspected) central nervous system malformation or damage in fetus, choroid plexus cysts, not applicable or unspecified: Secondary | ICD-10-CM

## 2020-02-18 DIAGNOSIS — O0993 Supervision of high risk pregnancy, unspecified, third trimester: Secondary | ICD-10-CM

## 2020-02-18 DIAGNOSIS — O99891 Other specified diseases and conditions complicating pregnancy: Secondary | ICD-10-CM | POA: Diagnosis not present

## 2020-02-18 DIAGNOSIS — O9A213 Injury, poisoning and certain other consequences of external causes complicating pregnancy, third trimester: Secondary | ICD-10-CM | POA: Diagnosis not present

## 2020-02-18 DIAGNOSIS — M25552 Pain in left hip: Secondary | ICD-10-CM

## 2020-02-18 DIAGNOSIS — Z3A35 35 weeks gestation of pregnancy: Secondary | ICD-10-CM

## 2020-02-18 DIAGNOSIS — Z8541 Personal history of malignant neoplasm of cervix uteri: Secondary | ICD-10-CM | POA: Diagnosis not present

## 2020-02-18 DIAGNOSIS — Z87891 Personal history of nicotine dependence: Secondary | ICD-10-CM | POA: Diagnosis not present

## 2020-02-18 DIAGNOSIS — M25551 Pain in right hip: Secondary | ICD-10-CM

## 2020-02-18 DIAGNOSIS — M25559 Pain in unspecified hip: Secondary | ICD-10-CM | POA: Insufficient documentation

## 2020-02-18 DIAGNOSIS — R1010 Upper abdominal pain, unspecified: Secondary | ICD-10-CM | POA: Insufficient documentation

## 2020-02-18 DIAGNOSIS — O350XX0 Maternal care for (suspected) central nervous system malformation in fetus, not applicable or unspecified: Secondary | ICD-10-CM

## 2020-02-18 DIAGNOSIS — O3440 Maternal care for other abnormalities of cervix, unspecified trimester: Secondary | ICD-10-CM

## 2020-02-18 DIAGNOSIS — R109 Unspecified abdominal pain: Secondary | ICD-10-CM

## 2020-02-18 DIAGNOSIS — O0990 Supervision of high risk pregnancy, unspecified, unspecified trimester: Secondary | ICD-10-CM

## 2020-02-18 DIAGNOSIS — Z9889 Other specified postprocedural states: Secondary | ICD-10-CM

## 2020-02-18 LAB — POCT URINALYSIS DIPSTICK OB
Glucose, UA: NEGATIVE
POC,PROTEIN,UA: NEGATIVE

## 2020-02-18 MED ORDER — ACETAMINOPHEN 500 MG PO TABS
500.0000 mg | ORAL_TABLET | Freq: Once | ORAL | Status: AC
  Administered 2020-02-18: 500 mg via ORAL
  Filled 2020-02-18: qty 1

## 2020-02-18 MED ORDER — FAMOTIDINE 20 MG PO TABS
20.0000 mg | ORAL_TABLET | Freq: Once | ORAL | Status: AC
  Administered 2020-02-18: 20 mg via ORAL
  Filled 2020-02-18: qty 1

## 2020-02-18 NOTE — Progress Notes (Signed)
Co

## 2020-02-18 NOTE — Progress Notes (Signed)
Routine Prenatal Care Visit  Subjective  Anna Woodard is a 29 y.o. [phone] at [redacted]w[redacted]d being seen today for ongoing prenatal care.  She is currently monitored for the following issues for this low-risk pregnancy and has Bartholin's gland abscess; Supervision of high risk pregnancy, antepartum; History of loop electrosurgical excision procedure (LEEP) of cervix affecting pregnancy, antepartum; and Choroid plexus cyst, fetal, affecting care of mother, antepartum, single gestation on their problem list.  ----------------------------------------------------------------------------------- Patient reports no complaints.    .  .   [name] Fluid denies.  ----------------------------------------------------------------------------------- The following portions of the patient's history were reviewed and updated as appropriate: allergies, current medications, past family history, past medical history, past social history, past surgical history and problem list. Problem list updated.  Objective  Blood pressure 120/60, weight 196 lb (88.9 kg), last menstrual period 05/10/2019. Pregravid weight 165 lb (74.8 kg) Total Weight Gain 31 lb (14.1 kg) Urinalysis: Urine Protein Negative  Urine Glucose Negative  Fetal Status:           General:  Alert, oriented and cooperative. Patient is in no acute distress.  Skin: Skin is warm and dry. No rash noted.   Cardiovascular: Normal heart rate noted  Respiratory: Normal respiratory effort, no problems with respiration noted  Abdomen: Soft, gravid, appropriate for gestational age.       Pelvic:  Cervical exam deferred        Extremities: Normal range of motion.     Mental Status: Normal mood and affect. Normal behavior. Normal judgment and thought content.   Assessment   29 y.o. [id number] at [redacted]w[redacted]d by  03/18/2020, by Ultrasound presenting for routine prenatal visit  Plan   Pregnancy #4 Problems (from 05/10/19 to present)    Problem Noted Resolved   Choroid plexus  cyst, fetal, affecting care of mother, antepartum, single gestation 10/25/2019 by Will Bonnet, MD No   Overview Signed 10/25/2019  2:27 PM by Will Bonnet, MD    Bilateral with 1 > 1 cm [ ]  f/u at 28 weeks [x]  offer aneuploidy screen      Supervision of high risk pregnancy, antepartum 08/14/2019 by Homero Fellers, MD No   Overview Addendum 01/03/2020 11:33 AM by Imagene Riches, CNM     Nursing Staff Provider  Office Location  [facility] Dating  9 wk u/s  Language  English Anatomy US  [ ]    Flu Vaccine   Genetic Screen  Declines   TDaP vaccine    Hgb A1C or  GTT Early : n/a Third trimester : 8/25  139  Rhogam  n/a   LAB RESULTS   Feeding Plan  Breast Blood Type O/Positive/-- (04/06 1453)   Contraception  Antibody Negative (04/06 1453)  Circumcision  na Rubella 4.30 (04/06 1453)  Pediatrician   RPR Non Reactive (04/06 1453)   Support Person  HBsAg Negative (04/06 1453)   Prenatal Classes  HIV Non Reactive (04/06 1453)    Varicella Immune  BTL Consent  GBS  (For PCN allergy, check sensitivities)        VBAC Consent  Pap  NILM, HPV neg    Hgb Electro      CF      SMA               Previous Version   History of loop electrosurgical excision procedure (LEEP) of cervix affecting pregnancy, antepartum 08/14/2019 by Homero Fellers, MD No   Overview Signed 08/14/2019  4:25 PM by Gilman Schmidt,  Christanna R, MD    [ ]  cervical length screening at 16 weeks by TVUS          Term labor symptoms and general obstetric precautions including but not limited to vaginal bleeding, contractions, leaking of fluid and fetal movement were reviewed in detail with the patient. Please refer to After Visit Summary for other counseling recommendations.  GBS retrieved today. She prefers not to be induced at 39 weeks.  Return in about 1 week (around 02/25/2020) for return OB.  Imagene Riches, CNM  02/18/2020 9:33 AM

## 2020-02-18 NOTE — Discharge Summary (Signed)
RN reviewed discharge instructions with patient. Gave patient opportunity for questions. All questions answered at this time. Pt verbalized understanding. Pt discharged home with her husband.

## 2020-02-18 NOTE — OB Triage Note (Signed)
Pt presents c/o a fall around 5:30 pm this evening. Pt states she tripped over the dog. She landed on her hands and knees and doesn't recall hitting her belly. She c/o being sore in her upper abdomen, lower back, and hips. Pt denies bleeding or LOF. Pt reported no fetal movement since the fall but has since felt movement while being here. VSS. Will continue to monitor.

## 2020-02-18 NOTE — Discharge Summary (Signed)
Physician Final Progress Note  Patient ID: Anna Woodard MRN: [id number] DOB/AGE: 1990/11/22 29 y.o.  Admit date: 02/18/2020 Admitting provider: Rod Can, CNM Discharge date: 02/18/2020   Admission Diagnoses: Fall without abdominal impact  Discharge Diagnoses:  Active Problems:   Labor and delivery, indication for care   [redacted] weeks gestation of pregnancy Reactive NST  History of Present Illness: The patient is a 29 y.o. female [phone] at [redacted]w[redacted]d who presents from home after having a fall around 5:30 PM tonight. She tripped over her dog and landed on her hands and knees. She denies any direct abdominal impact. She denies vaginal bleeding or leaking of fluid. She did not feel fetal movement until she arrived at the hospital. She reports pain in her hips and back and upper abdomen. She denies contractions.   Patient was admitted for observation and placed on monitors. Some contractions were noted initially which decreased with PO hydration. Patient did not feel the contractions. Fetal monitoring reassuring. Patient was discharged to home after several hours of monitoring- five hours after falling- with instructions and precautions.   Past Medical History:  Diagnosis Date  . Anxiety and depression   . Cancer ([place])    CERBVICAL  . 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 Complication of anesthesia    WAKES UP IN PANIC / CRYING  . Depression   . HPV (human papilloma virus) infection   . Panic disorder   . Panic disorder   . PTSD (post-traumatic stress disorder)     Past Surgical History:  Procedure Laterality Date  . BARTHOLIN CYST MARSUPIALIZATION N/A 09/09/2017   Procedure: BARTHOLIN CYST MARSUPIALIZATION;  Surgeon: Will Bonnet, MD;  Location: [facility] ORS;  Service: Gynecology;  Laterality: N/A;  . CERVICAL BIOPSY  W/ LOOP ELECTRODE EXCISION      No current facility-administered medications on file prior to encounter.   Current  Outpatient Medications on File Prior to Encounter  Medication Sig Dispense Refill  . acetaminophen (TYLENOL) 500 MG tablet Take 1,000 mg by mouth every 6 (six) hours as needed for moderate pain or headache.    . ADVAIR DISKUS 250-50 MCG/DOSE AEPB Inhale 1 puff into the lungs 2 (two) times daily.    . citalopram (CELEXA) 20 MG tablet Take 20 mg by mouth daily.    Marland Kitchen EPINEPHrine 0.3 mg/0.3 mL IJ SOAJ injection Inject 0.3 mg into the muscle as needed.    . famotidine (PEPCID) 40 MG tablet Take 1 tablet by mouth daily.    Marland Kitchen levocetirizine (XYZAL) 5 MG tablet Take 1 tablet by mouth daily.    . montelukast (SINGULAIR) 10 MG tablet Take 10 mg by mouth daily.    Marland Kitchen OVER THE COUNTER MEDICATION Take 1 tablet by mouth daily. [employer] prenatal vitamins      Allergies  Allergen Reactions  . Shellfish Allergy Anaphylaxis, Swelling and Other (See Comments)    Eyes swelling, thickening tongue  . Zithromax [Azithromycin] Anaphylaxis  . Molds & Smuts   . Other     See List scanned in to Media.  Marland Kitchen Penicillins   . Soy Allergy     Social History   Socioeconomic History  . Marital status: Married    Spouse name: Not on file  . Number of children: Not on file  . Years of education: Not on file  . Highest education level: Not on file  Occupational History  . Not on file  Tobacco Use  . Smoking status:  Former Smoker    Quit date: 08/08/2017    Years since quitting: 2.5  . Smokeless tobacco: Never Used  Vaping Use  . Vaping Use: Every day  Substance and Sexual Activity  . Alcohol use: Yes    Comment: RARE  . Drug use: Not Currently    Types: Marijuana  . Sexual activity: Yes    Birth control/protection: Surgical    Comment: vasectomy  Other Topics Concern  . Not on file  Social History Narrative  . Not on file   Social Determinants of Health   Financial Resource Strain:   . Difficulty of Paying Living Expenses: Not on file  Food Insecurity:   . Worried About Charity fundraiser in  the Last Year: Not on file  . Ran Out of Food in the Last Year: Not on file  Transportation Needs:   . Lack of Transportation (Medical): Not on file  . Lack of Transportation (Non-Medical): Not on file  Physical Activity:   . Days of Exercise per Week: Not on file  . Minutes of Exercise per Session: Not on file  Stress:   . Feeling of Stress : Not on file  Social Connections:   . Frequency of Communication with Friends and Family: Not on file  . Frequency of Social Gatherings with Friends and Family: Not on file  . Attends Religious Services: Not on file  . Active Member of Clubs or Organizations: Not on file  . Attends Archivist Meetings: Not on file  . Marital Status: Not on file  Intimate Partner Violence:   . Fear of Current or Ex-Partner: Not on file  . Emotionally Abused: Not on file  . Physically Abused: Not on file  . Sexually Abused: Not on file    Family History  Problem Relation Age of Onset  . Colon cancer Mother 68  . Hypertension Mother   . Stroke Mother   . Breast cancer Other 35  . Ovarian cancer Other 40     Review of Systems  Constitutional: Negative for chills and fever.  HENT: Negative for congestion, ear discharge, ear pain, hearing loss, sinus pain and sore throat.   Eyes: Negative for blurred vision and double vision.  Respiratory: Negative for cough, shortness of breath and wheezing.   Cardiovascular: Negative for chest pain, palpitations and leg swelling.  Gastrointestinal: Positive for abdominal pain and heartburn. Negative for blood in stool, constipation, diarrhea, melena, nausea and vomiting.  Genitourinary: Negative for dysuria, flank pain, frequency, hematuria and urgency.  Musculoskeletal: Positive for back pain. Negative for joint pain and myalgias.  Skin: Negative for itching and rash.  Neurological: Negative for dizziness, tingling, tremors, sensory change, speech change, focal weakness, seizures, loss of consciousness, weakness  and headaches.  Endo/Heme/Allergies: Negative for environmental allergies. Does not bruise/bleed easily.  Psychiatric/Behavioral: Negative for depression, hallucinations, memory loss, substance abuse and suicidal ideas. The patient is not nervous/anxious and does not have insomnia.      Physical Exam: BP 128/83 (BP Location: Left Arm)   Pulse 87   Temp 98.3 F (36.8 C) (Oral)   Resp 18   Ht 5\' 6"  (1.676 m)   Wt 88.9 kg   LMP 05/10/2019 (Approximate)   SpO2 100%   BMI 31.64 kg/m   Constitutional: Well nourished, well developed female in no acute distress.  HEENT: normal Skin: Warm and dry.  Cardiovascular: Regular rate and rhythm.   Extremity: no edema  Respiratory: Clear to auscultation bilateral. Normal respiratory  effort Abdomen: FHT present Back: no CVAT Neuro: DTRs 2+, Cranial nerves grossly intact Psych: Alert and Oriented x3. No memory deficits. Normal mood and affect.  MS: normal gait, normal bilateral lower extremity ROM/strength/stability.    Consults: None  Significant Findings/ Diagnostic Studies: none  Procedures: NST  Hospital Course: The patient was admitted to Labor and Delivery Triage for observation.   Discharge Condition: good  Disposition: Discharge disposition: 01-Home or Self Care  Diet: Regular diet  Discharge Activity: Activity as tolerated  Discharge Instructions    Discharge activity:  No Restrictions   Complete by: As directed    Discharge diet:  No restrictions   Complete by: As directed    Fetal Kick Count:  Lie on our left side for one hour after a meal, and count the number of times your baby kicks.  If it is less than 5 times, get up, move around and drink some juice.  Repeat the test 30 minutes later.  If it is still less than 5 kicks in an hour, notify your doctor.   Complete by: As directed    No sexual activity restrictions   Complete by: As directed    Notify physician for a general feeling that "something is not right"    Complete by: As directed    Notify physician for increase or change in vaginal discharge   Complete by: As directed    Notify physician for intestinal cramps, with or without diarrhea, sometimes described as "gas pain"   Complete by: As directed    Notify physician for leaking of fluid   Complete by: As directed    Notify physician for low, dull backache, unrelieved by heat or Tylenol   Complete by: As directed    Notify physician for menstrual like cramps   Complete by: As directed    Notify physician for pelvic pressure   Complete by: As directed    Notify physician for uterine contractions.  These may be painless and feel like the uterus is tightening or the baby is  "balling up"   Complete by: As directed    Notify physician for vaginal bleeding   Complete by: As directed    PRETERM LABOR:  Includes any of the follwing symptoms that occur between 20 - [redacted] weeks gestation.  If these symptoms are not stopped, preterm labor can result in preterm delivery, placing your baby at risk   Complete by: As directed      Allergies as of 02/18/2020      Reactions   Shellfish Allergy Anaphylaxis, Swelling, Other (See Comments)   Eyes swelling, thickening tongue   Zithromax [azithromycin] Anaphylaxis   Molds & Smuts    Other    See List scanned in to Media.   Penicillins    Soy Allergy       Medication List    TAKE these medications   acetaminophen 500 MG tablet Commonly known as: TYLENOL Take 1,000 mg by mouth every 6 (six) hours as needed for moderate pain or headache.   Advair Diskus 250-50 MCG/DOSE Aepb Generic drug: Fluticasone-Salmeterol Inhale 1 puff into the lungs 2 (two) times daily.   citalopram 20 MG tablet Commonly known as: CELEXA Take 20 mg by mouth daily.   EPINEPHrine 0.3 mg/0.3 mL Soaj injection Commonly known as: EPI-PEN Inject 0.3 mg into the muscle as needed.   famotidine 40 MG tablet Commonly known as: PEPCID Take 1 tablet by mouth daily.    levocetirizine 5 MG tablet Commonly  known as: XYZAL Take 1 tablet by mouth daily.   montelukast 10 MG tablet Commonly known as: SINGULAIR Take 10 mg by mouth daily.   OVER THE COUNTER MEDICATION Take 1 tablet by mouth daily. [employer] prenatal vitamins       Follow-up Information    [facility]. Go to.   Specialty: Obstetrics and Gynecology Why: scheduled prenatal appointment Contact information: [address] [place] [phone]              Total time spent taking care of this patient: 30 minutes  Signed: Rod Can, CNM  02/18/2020, 10:40 PM

## 2020-02-22 LAB — STREP GP B CULTURE+RFLX: Strep Gp B Culture+Rflx: NEGATIVE

## 2020-02-25 ENCOUNTER — Ambulatory Visit (INDEPENDENT_AMBULATORY_CARE_PROVIDER_SITE_OTHER): Payer: Medicaid Other | Admitting: Obstetrics

## 2020-02-25 ENCOUNTER — Other Ambulatory Visit: Payer: Self-pay

## 2020-02-25 VITALS — BP 110/70 | Wt 195.0 lb

## 2020-02-25 DIAGNOSIS — O0990 Supervision of high risk pregnancy, unspecified, unspecified trimester: Secondary | ICD-10-CM

## 2020-02-25 DIAGNOSIS — Z3A36 36 weeks gestation of pregnancy: Secondary | ICD-10-CM

## 2020-02-25 NOTE — Progress Notes (Signed)
C/o ED - fell - no issue c baby; pretty consistant ctxs over the weekend.

## 2020-02-25 NOTE — Progress Notes (Signed)
Routine Prenatal Care Visit  Subjective  Anna Woodard is a 29 y.o. [phone] at [redacted]w[redacted]d being seen today for ongoing prenatal care.  She is currently monitored for the following issues for this low-risk pregnancy and has Bartholin's gland abscess; Supervision of high risk pregnancy, antepartum; History of loop electrosurgical excision procedure (LEEP) of cervix affecting pregnancy, antepartum; Choroid plexus cyst, fetal, affecting care of mother, antepartum, single gestation; Labor and delivery, indication for care; and [redacted] weeks gestation of pregnancy on their problem list.  ----------------------------------------------------------------------------------- Patient reports she had an episode of contractions over the weekend that went away after a few hours.. her baby moves well. she is requesting IOL close to her due date, as she has no family support locally, needs to arrange care for her son.    .  .   [name] Fluid denies.  ----------------------------------------------------------------------------------- The following portions of the patient's history were reviewed and updated as appropriate: allergies, current medications, past family history, past medical history, past social history, past surgical history and problem list. Problem list updated.  Objective  Blood pressure 110/70, weight 195 lb (88.5 kg), last menstrual period 05/10/2019. Pregravid weight 165 lb (74.8 kg) Total Weight Gain 30 lb (13.6 kg) Urinalysis: Urine Protein    Urine Glucose    Fetal Status:           General:  Alert, oriented and cooperative. Patient is in no acute distress.  Skin: Skin is warm and dry. No rash noted.   Cardiovascular: Normal heart rate noted  Respiratory: Normal respiratory effort, no problems with respiration noted  Abdomen: Soft, gravid, appropriate for gestational age.       Pelvic:  Cervical exam performed      closed/50%/ballotable. Some cervical scarring suspected.  Extremities: Normal  range of motion.     Mental Status: Normal mood and affect. Normal behavior. Normal judgment and thought content.   Assessment   29 y.o. [id number] at [redacted]w[redacted]d by  03/18/2020, by Ultrasound presenting for routine prenatal visit  Plan   Pregnancy #4 Problems (from 05/10/19 to present)    Problem Noted Resolved   [redacted] weeks gestation of pregnancy 02/18/2020 by Rod Can, CNM No   Choroid plexus cyst, fetal, affecting care of mother, antepartum, single gestation 10/25/2019 by Will Bonnet, MD No   Overview Signed 10/25/2019  2:27 PM by Will Bonnet, MD    Bilateral with 1 > 1 cm [ ]  f/u at 28 weeks [x]  offer aneuploidy screen      Supervision of high risk pregnancy, antepartum 08/14/2019 by Homero Fellers, MD No   Overview Addendum 02/22/2020  3:14 PM by Imagene Riches, CNM     Nursing Staff Provider  Office Location  [facility] Dating  9 wk u/s  Language  English Anatomy US  [ ]    Flu Vaccine   Genetic Screen  Declines   TDaP vaccine    Hgb A1C or  GTT Early : n/a Third trimester : 8/25  139  Rhogam  n/a   LAB RESULTS   Feeding Plan  Breast Blood Type O/Positive/-- (04/06 1453)   Contraception  Antibody Negative (04/06 1453)  Circumcision  na Rubella 4.30 (04/06 1453)  Pediatrician   RPR Non Reactive (04/06 1453)   Support Person  HBsAg Negative (04/06 1453)   Prenatal Classes  HIV Non Reactive (04/06 1453)    Varicella Immune  BTL Consent  GBS  (For PCN allergy, check sensitivities) negative  VBAC Consent  Pap  NILM, HPV neg    Hgb Electro      CF      SMA               Previous Version   History of loop electrosurgical excision procedure (LEEP) of cervix affecting pregnancy, antepartum 08/14/2019 by Homero Fellers, MD No   Overview Signed 08/14/2019  4:25 PM by Homero Fellers, MD    [ ]  cervical length screening at 16 weeks by TVUS          Term labor symptoms and general obstetric precautions including but not limited to vaginal  bleeding, contractions, leaking of fluid and fetal movement were reviewed in detail with the patient. Please refer to After Visit Summary for other counseling recommendations.  Looking at Monday, November 8 or Tuesday the 9th for IOL. Will contact the unit to arrange, and formalize plan next visit in one week.  Return in about 1 week (around 03/03/2020) for return OB.  Imagene Riches, CNM  02/25/2020 3:44 PM

## 2020-02-27 ENCOUNTER — Telehealth: Payer: Self-pay

## 2020-02-27 NOTE — Telephone Encounter (Signed)
Pt calling; is experiencing what she thinks is vertigo; has happened c standing and sitting.  Is this normal?  [phone]  [facility]

## 2020-03-03 ENCOUNTER — Ambulatory Visit (INDEPENDENT_AMBULATORY_CARE_PROVIDER_SITE_OTHER): Payer: Medicaid Other | Admitting: Obstetrics and Gynecology

## 2020-03-03 ENCOUNTER — Encounter: Payer: Self-pay | Admitting: Obstetrics and Gynecology

## 2020-03-03 ENCOUNTER — Other Ambulatory Visit: Payer: Self-pay

## 2020-03-03 VITALS — BP 122/74 | Wt 198.0 lb

## 2020-03-03 DIAGNOSIS — Z9889 Other specified postprocedural states: Secondary | ICD-10-CM

## 2020-03-03 DIAGNOSIS — O0993 Supervision of high risk pregnancy, unspecified, third trimester: Secondary | ICD-10-CM

## 2020-03-03 DIAGNOSIS — O3443 Maternal care for other abnormalities of cervix, third trimester: Secondary | ICD-10-CM

## 2020-03-03 DIAGNOSIS — Z3A37 37 weeks gestation of pregnancy: Secondary | ICD-10-CM

## 2020-03-03 NOTE — Progress Notes (Signed)
Routine Prenatal Care Visit  Subjective  Anna Woodard is a 29 y.o. [phone] at [redacted]w[redacted]d being seen today for ongoing prenatal care.  She is currently monitored for the following issues for this low-risk pregnancy and has Bartholin's gland abscess; Supervision of high risk pregnancy, antepartum; History of loop electrosurgical excision procedure (LEEP) of cervix affecting pregnancy, antepartum; Choroid plexus cyst, fetal, affecting care of mother, antepartum, single gestation; Labor and delivery, indication for care; and [redacted] weeks gestation of pregnancy on their problem list.  ----------------------------------------------------------------------------------- Patient reports no complaints.   Contractions: Irritability. Vag. Bleeding: None.  Movement: Present. Leaking Fluid denies.  ----------------------------------------------------------------------------------- The following portions of the patient's history were reviewed and updated as appropriate: allergies, current medications, past family history, past medical history, past social history, past surgical history and problem list. Problem list updated.  Objective  Blood pressure 122/74, weight 198 lb (89.8 kg), last menstrual period 05/10/2019. Pregravid weight 165 lb (74.8 kg) Total Weight Gain 33 lb (15 kg) Urinalysis: Urine Protein    Urine Glucose    Fetal Status: Fetal Heart Rate (bpm): 135   Movement: Present  Presentation: Vertex  General:  Alert, oriented and cooperative. Patient is in no acute distress.  Skin: Skin is warm and dry. No rash noted.   Cardiovascular: Normal heart rate noted  Respiratory: Normal respiratory effort, no problems with respiration noted  Abdomen: Soft, gravid, appropriate for gestational age. Pain/Pressure: Absent     Pelvic:  Cervical exam deferred        Extremities: Normal range of motion.  Edema: None  Mental Status: Normal mood and affect. Normal behavior. Normal judgment and thought content.    Assessment   29 y.o. [id number] at [redacted]w[redacted]d by  03/18/2020, by Ultrasound presenting for routine prenatal visit  Plan   Pregnancy #4 Problems (from 05/10/19 to present)    Problem Noted Resolved   [redacted] weeks gestation of pregnancy 02/18/2020 by Rod Can, CNM No   Choroid plexus cyst, fetal, affecting care of mother, antepartum, single gestation 10/25/2019 by Will Bonnet, MD No   Overview Signed 10/25/2019  2:27 PM by Will Bonnet, MD    Bilateral with 1 > 1 cm [ ]  f/u at 28 weeks [x]  offer aneuploidy screen      Supervision of high risk pregnancy, antepartum 08/14/2019 by Homero Fellers, MD No   Overview Addendum 02/22/2020  3:14 PM by Imagene Riches, CNM     Nursing Staff Provider  Office Location  [facility] Dating  9 wk u/s  Language  English Anatomy US  [ ]    Flu Vaccine   Genetic Screen  Declines   TDaP vaccine    Hgb A1C or  GTT Early : n/a Third trimester : 8/25  139  Rhogam  n/a   LAB RESULTS   Feeding Plan  Breast Blood Type O/Positive/-- (04/06 1453)   Contraception  Antibody Negative (04/06 1453)  Circumcision  na Rubella 4.30 (04/06 1453)  Pediatrician   RPR Non Reactive (04/06 1453)   Support Person  HBsAg Negative (04/06 1453)   Prenatal Classes  HIV Non Reactive (04/06 1453)    Varicella Immune  BTL Consent  GBS  (For PCN allergy, check sensitivities) negative       VBAC Consent  Pap  NILM, HPV neg    Hgb Electro      CF      SMA               Previous Version  History of loop electrosurgical excision procedure (LEEP) of cervix affecting pregnancy, antepartum 08/14/2019 by Homero Fellers, MD No   Overview Signed 08/14/2019  4:25 PM by Homero Fellers, MD    [ ]  cervical length screening at 16 weeks by TVUS          Term labor symptoms and general obstetric precautions including but not limited to vaginal bleeding, contractions, leaking of fluid and fetal movement were reviewed in detail with the patient. Please refer  to After Visit Summary for other counseling recommendations.   Return in about 1 week (around 03/10/2020) for Routine Prenatal Appointment.   Prentice Docker, MD, [facility], [facility] 03/03/2020 5:09 PM

## 2020-03-04 NOTE — Telephone Encounter (Signed)
Pt was seen 03/03/20 by SDJ.  Message closed.

## 2020-03-13 ENCOUNTER — Ambulatory Visit (INDEPENDENT_AMBULATORY_CARE_PROVIDER_SITE_OTHER): Payer: Medicaid Other | Admitting: Advanced Practice Midwife

## 2020-03-13 ENCOUNTER — Other Ambulatory Visit: Payer: Self-pay

## 2020-03-13 ENCOUNTER — Telehealth: Payer: Self-pay

## 2020-03-13 VITALS — BP 124/74 | Wt 205.0 lb

## 2020-03-13 DIAGNOSIS — Z23 Encounter for immunization: Secondary | ICD-10-CM

## 2020-03-13 DIAGNOSIS — O3503X0 Maternal care for (suspected) central nervous system malformation or damage in fetus, choroid plexus cysts, not applicable or unspecified: Secondary | ICD-10-CM

## 2020-03-13 DIAGNOSIS — O0993 Supervision of high risk pregnancy, unspecified, third trimester: Secondary | ICD-10-CM

## 2020-03-13 DIAGNOSIS — Z3A39 39 weeks gestation of pregnancy: Secondary | ICD-10-CM

## 2020-03-13 DIAGNOSIS — Z9889 Other specified postprocedural states: Secondary | ICD-10-CM

## 2020-03-13 DIAGNOSIS — O350XX0 Maternal care for (suspected) central nervous system malformation in fetus, not applicable or unspecified: Secondary | ICD-10-CM

## 2020-03-13 DIAGNOSIS — O3443 Maternal care for other abnormalities of cervix, third trimester: Secondary | ICD-10-CM

## 2020-03-13 LAB — POCT URINALYSIS DIPSTICK OB
Glucose, UA: NEGATIVE
POC,PROTEIN,UA: NEGATIVE

## 2020-03-13 NOTE — Progress Notes (Signed)
Routine Prenatal Care Visit  Subjective  Anna Woodard is a 29 y.o. [phone] at [redacted]w[redacted]d being seen today for ongoing prenatal care.  She is currently monitored for the following issues for this high-risk pregnancy and has Bartholin's gland abscess; Supervision of high risk pregnancy, antepartum; History of loop electrosurgical excision procedure (LEEP) of cervix affecting pregnancy, antepartum; Choroid plexus cyst, fetal, affecting care of mother, antepartum, single gestation; Labor and delivery, indication for care; and [redacted] weeks gestation of pregnancy on their problem list.  ----------------------------------------------------------------------------------- Patient reports general discomforts.   Contractions: Irregular. Vag. Bleeding: None.  Movement: Present. Leaking Fluid denies.  ----------------------------------------------------------------------------------- The following portions of the patient's history were reviewed and updated as appropriate: allergies, current medications, past family history, past medical history, past social history, past surgical history and problem list. Problem list updated.  Objective  Blood pressure 124/74, weight 205 lb (93 kg), last menstrual period 05/10/2019. Pregravid weight 165 lb (74.8 kg) Total Weight Gain 40 lb (18.1 kg) Urinalysis: Urine Protein Negative  Urine Glucose Negative  Fetal Status: Fetal Heart Rate (bpm): 142   Movement: Present  Presentation: Vertex  General:  Alert, oriented and cooperative. Patient is in no acute distress.  Skin: Skin is warm and dry. No rash noted.   Cardiovascular: Normal heart rate noted  Respiratory: Normal respiratory effort, no problems with respiration noted  Abdomen: Soft, gravid, appropriate for gestational age. Pain/Pressure: Present     Pelvic:  Cervical exam performed Dilation: 1 Effacement (%): 50 Station: -3  Extremities: Normal range of motion.  Edema: None  Mental Status: Normal mood and affect.  Normal behavior. Normal judgment and thought content.   Assessment   29 y.o. [id number] at [redacted]w[redacted]d by  03/18/2020, by Ultrasound presenting for routine prenatal visit  Plan   Pregnancy #4 Problems (from 05/10/19 to present)    Problem Noted Resolved   [redacted] weeks gestation of pregnancy 02/18/2020 by Rod Can, CNM No   Choroid plexus cyst, fetal, affecting care of mother, antepartum, single gestation 10/25/2019 by Will Bonnet, MD No   Overview Signed 10/25/2019  2:27 PM by Will Bonnet, MD    Bilateral with 1 > 1 cm [ ]  f/u at 28 weeks [x]  offer aneuploidy screen      Supervision of high risk pregnancy, antepartum 08/14/2019 by Homero Fellers, MD No   Overview Addendum 02/22/2020  3:14 PM by Imagene Riches, CNM     Nursing Staff Provider  Office Location  [facility] Dating  9 wk u/s  Language  English Anatomy US  [ ]    Flu Vaccine   Genetic Screen  Declines   TDaP vaccine    Hgb A1C or  GTT Early : n/a Third trimester : 8/25  139  Rhogam  n/a   LAB RESULTS   Feeding Plan  Breast Blood Type O/Positive/-- (04/06 1453)   Contraception  Antibody Negative (04/06 1453)  Circumcision  na Rubella 4.30 (04/06 1453)  Pediatrician   RPR Non Reactive (04/06 1453)   Support Person  HBsAg Negative (04/06 1453)   Prenatal Classes  HIV Non Reactive (04/06 1453)    Varicella Immune  BTL Consent  GBS  (For PCN allergy, check sensitivities) negative       VBAC Consent  Pap  NILM, HPV neg    Hgb Electro      CF      SMA               Previous Version  History of loop electrosurgical excision procedure (LEEP) of cervix affecting pregnancy, antepartum 08/14/2019 by Homero Fellers, MD No   Overview Signed 08/14/2019  4:25 PM by Homero Fellers, MD    [ ]  cervical length screening at 16 weeks by TVUS          Term labor symptoms and general obstetric precautions including but not limited to vaginal bleeding, contractions, leaking of fluid and fetal movement  were reviewed in detail with the patient.   Covid swab: 03/14/20 [facility] IOL: 03/17/20 5AM   Rod Can, CNM 03/13/2020 11:52 AM

## 2020-03-13 NOTE — H&P (Signed)
OB History & Physical   History of Present Illness:  Date of initial H&P: 03/13/2020 Scheduled for induction: 03/17/2020  Chief Complaint: elective induction of labor  HPI:  Anna Woodard is a 29 y.o. G79P1021 female at [redacted]w[redacted]d dated by 9 week ultrasound.  Her pregnancy has been complicated by Bartholin's gland abscess; Supervision of high risk pregnancy, antepartum; History of loop electrosurgical excision procedure (LEEP) of cervix affecting pregnancy, antepartum; Choroid plexus cyst, fetal, affecting care of mother.    She reports irregular contractions.   She denies leakage of fluid.   She denies vaginal bleeding.   She reports fetal movement.    Total weight gain for pregnancy: 40 lb (18.1 kg)   Obstetrical Problem List: Pregnancy #4 Problems (from 05/10/19 to present)    Problem Noted Resolved   [redacted] weeks gestation of pregnancy 02/18/2020 by Rod Can, CNM No   Choroid plexus cyst, fetal, affecting care of mother, antepartum, single gestation 10/25/2019 by Will Bonnet, MD No   Overview Signed 10/25/2019  2:27 PM by Will Bonnet, MD    Bilateral with 1 > 1 cm [ ]  f/u at 28 weeks [x]  offer aneuploidy screen      Supervision of high risk pregnancy, antepartum 08/14/2019 by Homero Fellers, MD No   Overview Addendum 02/22/2020  3:14 PM by Imagene Riches, CNM     Nursing Staff Provider  Office Location  [facility] Dating  9 wk u/s  Language  English Anatomy US  [ ]    Flu Vaccine   Genetic Screen  Declines   TDaP vaccine    Hgb A1C or  GTT Early : n/a Third trimester : 8/25  139  Rhogam  n/a   LAB RESULTS   Feeding Plan  Breast Blood Type O/Positive/-- (04/06 1453)   Contraception  Antibody Negative (04/06 1453)  Circumcision  na Rubella 4.30 (04/06 1453)  Pediatrician   RPR Non Reactive (04/06 1453)   Support Person  HBsAg Negative (04/06 1453)   Prenatal Classes  HIV Non Reactive (04/06 1453)    Varicella Immune  BTL Consent  GBS  (For PCN allergy, check  sensitivities) negative GC/CT: neg/neg       VBAC Consent  Pap  NILM, HPV neg    Hgb Electro      CF      SMA               Previous Version   History of loop electrosurgical excision procedure (LEEP) of cervix affecting pregnancy, antepartum 08/14/2019 by Homero Fellers, MD No   Overview Signed 08/14/2019  4:25 PM by Homero Fellers, MD    [ ]  cervical length screening at 16 weeks by TVUS          Maternal Medical History:   Past Medical History:  Diagnosis Date  . Anxiety and depression   . Cancer ([place])    CERBVICAL  . 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 Complication of anesthesia    WAKES UP IN PANIC / CRYING  . Depression   . HPV (human papilloma virus) infection   . Panic disorder   . Panic disorder   . PTSD (post-traumatic stress disorder)     Past Surgical History:  Procedure Laterality Date  . BARTHOLIN CYST MARSUPIALIZATION N/A 09/09/2017   Procedure: BARTHOLIN CYST MARSUPIALIZATION;  Surgeon: Will Bonnet, MD;  Location: [facility] ORS;  Service: Gynecology;  Laterality: N/A;  . CERVICAL BIOPSY  W/ LOOP ELECTRODE EXCISION      Allergies  Allergen Reactions  . Shellfish Allergy Anaphylaxis, Swelling and Other (See Comments)    Eyes swelling, thickening tongue  . Zithromax [Azithromycin] Anaphylaxis  . Molds & Smuts   . Other     See List scanned in to Media.  Marland Kitchen Penicillins   . Soy Allergy     Prior to Admission medications   Medication Sig Start Date End Date Taking? Authorizing Provider  acetaminophen (TYLENOL) 500 MG tablet Take 1,000 mg by mouth every 6 (six) hours as needed for moderate pain or headache.    [provider]  ADVAIR DISKUS 250-50 MCG/DOSE AEPB Inhale 1 puff into the lungs 2 (two) times daily. 01/11/20   [provider]  citalopram (CELEXA) 20 MG tablet Take 20 mg by mouth daily. 01/16/20   [provider]  EPINEPHrine 0.3 mg/0.3 mL IJ SOAJ injection  Inject 0.3 mg into the muscle as needed. 01/01/20   [provider]  famotidine (PEPCID) 40 MG tablet Take 1 tablet by mouth daily. 08/28/19   [provider]  levocetirizine (XYZAL) 5 MG tablet Take 1 tablet by mouth daily. 01/11/20   [provider]  montelukast (SINGULAIR) 10 MG tablet Take 10 mg by mouth daily. 01/11/20   [provider]  OVER THE COUNTER MEDICATION Take 1 tablet by mouth daily. [employer] prenatal vitamins    [provider]    OB History  Gravida Para Term Preterm AB Living  4 1 1   2 1   SAB TAB Ectopic Multiple Live Births  2       1    # Outcome Date GA Lbr Len/2nd Weight Sex Delivery Anes PTL Lv  4 Current           3 SAB 2019          2 SAB 2016          1 Term 03/05/13 [redacted]w[redacted]d  8 lb 4 oz (3.742 kg) M Vag-Spont   LIV    Prenatal care site: [facility]  Social History: She  reports that she quit smoking about 2 years ago. She has never used smokeless tobacco. She reports current alcohol use. She reports previous drug use. Drug: Marijuana.  Family History: family history includes Breast cancer (age of onset: 27) in an other family member; Colon cancer (age of onset: 46) in her mother; Hypertension in her mother; Ovarian cancer (age of onset: 8) in an other family member; Stroke in her mother.    Review of Systems:  Review of Systems  Constitutional: Negative for chills and fever.  HENT: Negative for congestion, ear discharge, ear pain, hearing loss, sinus pain and sore throat.   Eyes: Negative for blurred vision and double vision.  Respiratory: Negative for cough, shortness of breath and wheezing.   Cardiovascular: Negative for chest pain, palpitations and leg swelling.  Gastrointestinal: Negative for abdominal pain, blood in stool, constipation, diarrhea, heartburn, melena, nausea and vomiting.  Genitourinary: Negative for dysuria, flank pain, frequency, hematuria and urgency.  Musculoskeletal: Negative for  back pain, joint pain and myalgias.  Skin: Negative for itching and rash.  Neurological: Negative for dizziness, tingling, tremors, sensory change, speech change, focal weakness, seizures, loss of consciousness, weakness and headaches.  Endo/Heme/Allergies: Negative for environmental allergies. Does not bruise/bleed easily.  Psychiatric/Behavioral: Negative for depression, hallucinations, memory loss, substance abuse and suicidal ideas. The patient is not nervous/anxious and does not have insomnia.  Physical Exam:  BP 124/74   Wt 205 lb (93 kg)   LMP 05/10/2019 (Approximate)   BMI 33.09 kg/m   Constitutional: Well nourished, well developed female in no acute distress.  HEENT: normal Skin: Warm and dry.  Cardiovascular: Regular rate and rhythm.   Extremity: no edema  Respiratory: Clear to auscultation bilateral. Normal respiratory effort Abdomen: FHT present Back: no CVAT Neuro: DTRs 2+, Cranial nerves grossly intact Psych: Alert and Oriented x3. No memory deficits. Normal mood and affect.  MS: normal gait, normal bilateral lower extremity ROM/strength/stability.  Pelvic exam: (female chaperone present) is not limited by body habitus EGBUS: within normal limits Vagina: within normal limits and with normal mucosa blood in the vault Cervix: 1/50/-3  Current covid test pending  Lab Results  Component Value Date   [place] Not Detected 04/19/2019  ]  Assessment:  Anna Woodard is a 29 y.o. G81P1021 female at [redacted]w[redacted]d with elective induction of labor.   Plan:  1. Admit to Labor & Delivery  2. CBC, T&S, Clrs, IVF 3. GBS negative.   4. Fetal well-being: reassuring 5. Begin induction based on admission exam 6. Hx of LEEP: assess cervix for scar tissue   Rod Can, CNM 03/13/2020 1:10 PM

## 2020-03-13 NOTE — Telephone Encounter (Signed)
Pt calling; was seen today; JEG couldn't find anything in chart about her IOL that is supposed to be 11/8; also pt has noticed on her f/u notes there are a lot of incomplete information.  [phone]  Call L&D; spoke c Cherrell who confirms pt is schedule for IOL 11/8 at 5am.

## 2020-03-14 ENCOUNTER — Other Ambulatory Visit
Admission: RE | Admit: 2020-03-14 | Discharge: 2020-03-14 | Disposition: A | Discharge status: Home / Self Care | Payer: Medicaid Other | Source: Ambulatory Visit | Attending: Obstetrics and Gynecology | Admitting: Obstetrics and Gynecology

## 2020-03-14 DIAGNOSIS — Z20822 Contact with and (suspected) exposure to covid-19: Secondary | ICD-10-CM | POA: Insufficient documentation

## 2020-03-14 DIAGNOSIS — Z01812 Encounter for preprocedural laboratory examination: Secondary | ICD-10-CM | POA: Insufficient documentation

## 2020-03-14 NOTE — Telephone Encounter (Signed)
Left detailed msg; called L&D; she is scheduled for IOL 03/17/20 at 5am; as for the incomplete notes she will need to d/w provider as I am not allowed to change their notes.

## 2020-03-15 LAB — SARS CORONAVIRUS 2 (TAT 6-24 HRS): SARS Coronavirus 2: NEGATIVE

## 2020-03-17 ENCOUNTER — Inpatient Hospital Stay
Admission: EM | Admit: 2020-03-17 | Discharge: 2020-03-19 | DRG: 807 | Disposition: A | Discharge status: Home / Self Care | Payer: Medicaid Other | Attending: Obstetrics and Gynecology | Admitting: Obstetrics and Gynecology

## 2020-03-17 ENCOUNTER — Inpatient Hospital Stay: Payer: Medicaid Other | Admitting: Anesthesiology

## 2020-03-17 ENCOUNTER — Other Ambulatory Visit: Payer: Self-pay

## 2020-03-17 ENCOUNTER — Encounter: Payer: Self-pay | Admitting: Advanced Practice Midwife

## 2020-03-17 DIAGNOSIS — O26893 Other specified pregnancy related conditions, third trimester: Secondary | ICD-10-CM | POA: Diagnosis present

## 2020-03-17 DIAGNOSIS — O350XX0 Maternal care for (suspected) central nervous system malformation in fetus, not applicable or unspecified: Secondary | ICD-10-CM

## 2020-03-17 DIAGNOSIS — Z3490 Encounter for supervision of normal pregnancy, unspecified, unspecified trimester: Secondary | ICD-10-CM | POA: Diagnosis present

## 2020-03-17 DIAGNOSIS — Z3A39 39 weeks gestation of pregnancy: Secondary | ICD-10-CM

## 2020-03-17 DIAGNOSIS — O3503X0 Maternal care for (suspected) central nervous system malformation or damage in fetus, choroid plexus cysts, not applicable or unspecified: Secondary | ICD-10-CM

## 2020-03-17 DIAGNOSIS — Z9889 Other specified postprocedural states: Secondary | ICD-10-CM

## 2020-03-17 DIAGNOSIS — O480 Post-term pregnancy: Secondary | ICD-10-CM | POA: Diagnosis not present

## 2020-03-17 DIAGNOSIS — Z3A40 40 weeks gestation of pregnancy: Secondary | ICD-10-CM | POA: Diagnosis not present

## 2020-03-17 DIAGNOSIS — Z87891 Personal history of nicotine dependence: Secondary | ICD-10-CM | POA: Diagnosis not present

## 2020-03-17 DIAGNOSIS — Z3A35 35 weeks gestation of pregnancy: Secondary | ICD-10-CM

## 2020-03-17 DIAGNOSIS — O3440 Maternal care for other abnormalities of cervix, unspecified trimester: Secondary | ICD-10-CM

## 2020-03-17 DIAGNOSIS — O0990 Supervision of high risk pregnancy, unspecified, unspecified trimester: Secondary | ICD-10-CM

## 2020-03-17 LAB — CBC
HCT: 33.6 % — ABNORMAL LOW (ref 36.0–46.0)
Hemoglobin: 11.6 g/dL — ABNORMAL LOW (ref 12.0–15.0)
MCH: 30.7 pg (ref 26.0–34.0)
MCHC: 34.5 g/dL (ref 30.0–36.0)
MCV: 88.9 fL (ref 80.0–100.0)
Platelets: 296 10*3/uL (ref 150–400)
RBC: 3.78 MIL/uL — ABNORMAL LOW (ref 3.87–5.11)
RDW: 13.7 % (ref 11.5–15.5)
WBC: 11.8 10*3/uL — ABNORMAL HIGH (ref 4.0–10.5)
nRBC: 0 % (ref 0.0–0.2)

## 2020-03-17 LAB — RPR: RPR Ser Ql: NONREACTIVE

## 2020-03-17 LAB — ABO/RH: ABO/RH(D): O POS

## 2020-03-17 LAB — TYPE AND SCREEN
ABO/RH(D): O POS
Antibody Screen: NEGATIVE

## 2020-03-17 MED ORDER — ACETAMINOPHEN 325 MG PO TABS: 650.0000 mg | ORAL_TABLET | ORAL | Status: DC | PRN

## 2020-03-17 MED ORDER — BUTORPHANOL TARTRATE 1 MG/ML IJ SOLN: 1.0000 mg | INTRAMUSCULAR | Status: DC | PRN

## 2020-03-17 MED ORDER — OXYTOCIN 10 UNIT/ML IJ SOLN
INTRAMUSCULAR | Status: AC
  Filled 2020-03-17: qty 2

## 2020-03-17 MED ORDER — LIDOCAINE HCL (PF) 1 % IJ SOLN
30.0000 mL | INTRAMUSCULAR | Status: DC | PRN
  Filled 2020-03-17: qty 30

## 2020-03-17 MED ORDER — MISOPROSTOL 25 MCG QUARTER TABLET
25.0000 ug | ORAL_TABLET | ORAL | Status: DC | PRN
  Administered 2020-03-17: 25 ug via VAGINAL
  Filled 2020-03-17: qty 1

## 2020-03-17 MED ORDER — LACTATED RINGERS IV SOLN: 500.0000 mL | INTRAVENOUS | Status: DC | PRN

## 2020-03-17 MED ORDER — LACTATED RINGERS IV SOLN: INTRAVENOUS | Status: DC

## 2020-03-17 MED ORDER — BUPIVACAINE HCL (PF) 0.25 % IJ SOLN
INTRAMUSCULAR | Status: DC | PRN
  Administered 2020-03-17 (×2): 4 mL via EPIDURAL

## 2020-03-17 MED ORDER — FENTANYL 2.5 MCG/ML W/ROPIVACAINE 0.15% IN NS 100 ML EPIDURAL (ARMC)
12.0000 mL/h | EPIDURAL | Status: DC
  Administered 2020-03-17 (×2): 12 mL/h via EPIDURAL
  Filled 2020-03-17 (×2): qty 100

## 2020-03-17 MED ORDER — FENTANYL 2.5 MCG/ML W/ROPIVACAINE 0.15% IN NS 100 ML EPIDURAL (ARMC)
EPIDURAL | Status: AC
  Administered 2020-03-18: 12 mL/h via EPIDURAL
  Filled 2020-03-17: qty 100

## 2020-03-17 MED ORDER — OXYTOCIN-SODIUM CHLORIDE 30-0.9 UT/500ML-% IV SOLN
2.5000 [IU]/h | INTRAVENOUS | Status: DC
  Administered 2020-03-18: 2.5 [IU]/h via INTRAVENOUS
  Filled 2020-03-17: qty 500

## 2020-03-17 MED ORDER — PHENYLEPHRINE 40 MCG/ML (10ML) SYRINGE FOR IV PUSH (FOR BLOOD PRESSURE SUPPORT): 80.0000 ug | PREFILLED_SYRINGE | INTRAVENOUS | Status: DC | PRN

## 2020-03-17 MED ORDER — OXYTOCIN BOLUS FROM INFUSION
333.0000 mL | Freq: Once | INTRAVENOUS | Status: AC
  Administered 2020-03-18: 333 mL via INTRAVENOUS

## 2020-03-17 MED ORDER — ONDANSETRON HCL 4 MG/2ML IJ SOLN: 4.0000 mg | Freq: Four times a day (QID) | INTRAMUSCULAR | Status: DC | PRN

## 2020-03-17 MED ORDER — OXYTOCIN-SODIUM CHLORIDE 30-0.9 UT/500ML-% IV SOLN: 1.0000 m[IU]/min | INTRAVENOUS | Status: DC

## 2020-03-17 MED ORDER — MISOPROSTOL 200 MCG PO TABS
ORAL_TABLET | ORAL | Status: AC
  Filled 2020-03-17: qty 4

## 2020-03-17 MED ORDER — LIDOCAINE HCL (PF) 1 % IJ SOLN
INTRAMUSCULAR | Status: DC | PRN
  Administered 2020-03-17: 3 mL

## 2020-03-17 MED ORDER — TERBUTALINE SULFATE 1 MG/ML IJ SOLN: 0.2500 mg | Freq: Once | INTRAMUSCULAR | Status: DC | PRN

## 2020-03-17 MED ORDER — EPHEDRINE 5 MG/ML INJ: 10.0000 mg | INTRAVENOUS | Status: DC | PRN

## 2020-03-17 MED ORDER — DIPHENHYDRAMINE HCL 50 MG/ML IJ SOLN: 12.5000 mg | INTRAMUSCULAR | Status: DC | PRN

## 2020-03-17 MED ORDER — OXYTOCIN-SODIUM CHLORIDE 30-0.9 UT/500ML-% IV SOLN
1.0000 m[IU]/min | INTRAVENOUS | Status: DC
  Administered 2020-03-17: 1 m[IU]/min via INTRAVENOUS

## 2020-03-17 MED ORDER — LACTATED RINGERS IV SOLN
500.0000 mL | Freq: Once | INTRAVENOUS | Status: AC
  Administered 2020-03-17: 500 mL via INTRAVENOUS

## 2020-03-17 MED ORDER — AMMONIA AROMATIC IN INHA
RESPIRATORY_TRACT | Status: AC
  Filled 2020-03-17: qty 10

## 2020-03-17 MED ORDER — OXYTOCIN-SODIUM CHLORIDE 30-0.9 UT/500ML-% IV SOLN
1.0000 m[IU]/min | INTRAVENOUS | Status: DC
  Filled 2020-03-17: qty 500

## 2020-03-17 NOTE — Anesthesia Procedure Notes (Signed)
Epidural Patient location during procedure: OB Start time: 03/17/2020 1:32 PM End time: 03/17/2020 1:36 PM  Staffing Anesthesiologist: Piscitello, Precious Haws, MD Resident/CRNA: Doreen Salvage, CRNA Performed: resident/CRNA   Preanesthetic Checklist Completed: patient identified, IV checked, site marked, risks and benefits discussed, surgical consent, monitors and equipment checked, pre-op evaluation and timeout performed  Epidural Patient position: sitting Prep: ChloraPrep Patient monitoring: heart rate, continuous pulse ox and blood pressure Approach: midline Location: L3-L4 Injection technique: LOR saline  Needle:  Needle type: Tuohy  Needle gauge: 17 G Needle length: 9 cm and 9 Needle insertion depth: 7.5 cm Catheter type: closed end flexible Catheter size: 19 Gauge Catheter at skin depth: 13 cm Test dose: negative and 1.5% lidocaine with Epi 1:200 K  Assessment Sensory level: T10 Events: blood not aspirated, injection not painful, no injection resistance, no paresthesia and negative IV test  Additional Notes 1 attempt Pt. Evaluated and documentation done after procedure finished. Patient identified. Risks/Benefits/Options discussed with patient including but not limited to bleeding, infection, nerve damage, paralysis, failed block, incomplete pain control, headache, blood pressure changes, nausea, vomiting, reactions to medication both or allergic, itching and postpartum back pain. Confirmed with bedside nurse the patient's most recent platelet count. Confirmed with patient that they are not currently taking any anticoagulation, have any bleeding history or any family history of bleeding disorders. Patient expressed understanding and wished to proceed. All questions were answered. Sterile technique was used throughout the entire procedure. Please see nursing notes for vital signs. Test dose was given through epidural catheter and negative prior to continuing to dose epidural or  start infusion. Warning signs of high block given to the patient including shortness of breath, tingling/numbness in hands, complete motor block, or any concerning symptoms with instructions to call for help. Patient was given instructions on fall risk and not to get out of bed. All questions and concerns addressed with instructions to call with any issues or inadequate analgesia.   Patient tolerated the insertion well without immediate complications.Reason for block:procedure for pain

## 2020-03-17 NOTE — Progress Notes (Signed)
  Labor Progress Note   29 y.o. [id number] @ [redacted]w[redacted]d , admitted for  Pregnancy, Labor Management.   Subjective:  Coping well with cramping contractions- sitting on ball  Objective:  BP 125/74 (BP Location: Left Arm)   Pulse 77   Temp 97.6 F (36.4 C) (Axillary)   Resp 16   Ht 5\' 6"  (1.676 m)   Wt 93 kg   LMP 05/10/2019 (Approximate)   BMI 33.09 kg/m  Abd: gravid, ND, FHT present, mild tenderness on exam Extr: no edema SVE: CERVIX: 2.5 cm dilated, 50-60 effaced, -2 station, cervical sweep  EFM: FHR: 135 bpm, variability: moderate,  accelerations:  Present,  decelerations:  Absent Toco: Frequency: Every 2.5-3.5 minutes Labs: I have reviewed the patient's lab results.   Assessment & Plan:  [id number] @ [redacted]w[redacted]d, admitted for  Pregnancy and Labor/Delivery Management  1. Pain management: position changes. 2. FWB: FHT category I 3. ID: GBS negative 4. Labor management: no cytotec at this time due to frequency of contractions. Re-evaluate as needed and q 4 hours for decreased frequency. Cytotec if cervix is still thick, pitocin if effaced. AROM when appropriate.  All discussed with patient, see orders   Rod Can, [facility] 03/17/2020  10:52 AM

## 2020-03-17 NOTE — Anesthesia Preprocedure Evaluation (Signed)
Anesthesia Evaluation  Patient identified by MRN, date of birth, ID band Patient awake    Reviewed: Allergy & Precautions, H&P , NPO status , Patient's Chart, lab work & pertinent test results  History of Anesthesia Complications (+) Emergence Delirium and history of anesthetic complications  Airway Mallampati: II  TM Distance: >3 FB Neck ROM: full    Dental no notable dental hx.    Pulmonary former smoker,    Pulmonary exam normal        Cardiovascular negative cardio ROS Normal cardiovascular exam     Neuro/Psych Anxiety Depression negative neurological ROS     GI/Hepatic negative GI ROS, Neg liver ROS,   Endo/Other  negative endocrine ROS  Renal/GU negative Renal ROS  negative genitourinary   Musculoskeletal   Abdominal   Peds  Hematology negative hematology ROS (+)   Anesthesia Other Findings   Reproductive/Obstetrics (+) Pregnancy                             Anesthesia Physical Anesthesia Plan  ASA: II  Anesthesia Plan: Epidural   Post-op Pain Management:    Induction:   PONV Risk Score and Plan:   Airway Management Planned:   Additional Equipment:   Intra-op Plan:   Post-operative Plan:   Informed Consent: I have reviewed the patients History and Physical, chart, labs and discussed the procedure including the risks, benefits and alternatives for the proposed anesthesia with the patient or authorized representative who has indicated his/her understanding and acceptance.       Plan Discussed with: Anesthesiologist  Anesthesia Plan Comments:         Anesthesia Quick Evaluation

## 2020-03-17 NOTE — H&P (Signed)
History and Physical Interval Note:  03/17/2020 6:44 AM  Anna Woodard  has presented today for INDUCTION OF LABOR (cervical ripening agents),  with the diagnosis of Favorable cervix at term. The various methods of treatment have been discussed with the patient and family. After consideration of risks, benefits and other options for treatment, the patient has consented to  Labor induction .  The patient's history has been reviewed, patient examined, no change in status, and is stable for induction as planned.  See H&P. I have reviewed the patient's chart and labs.  Questions were answered to the patient's satisfaction.    Barnett Applebaum, MD, [facility], [facility] 03/17/2020  6:44 AM

## 2020-03-17 NOTE — Progress Notes (Signed)
  Labor Progress Note   29 y.o. [id number] @ [redacted]w[redacted]d , admitted for  Pregnancy, Labor Management.   Subjective:  Comfortable with epidural  Objective:  BP 120/70   Pulse 76   Temp 98.4 F (36.9 C) (Axillary)   Resp 18   Ht 5\' 6"  (1.676 m)   Wt 93 kg   LMP 05/10/2019 (Approximate)   SpO2 97%   BMI 33.09 kg/m  Abd: gravid, ND, FHT present, mild tenderness on exam Extr: no edema SVE: CERVIX: 3 cm dilated, 60-70 effaced, -2 station, cervical stretching, bloody show, attempted AROM with possible scant fluid mixed in with blood  EFM: FHR: 125 bpm, variability: moderate,  accelerations:  Present,  decelerations:  Absent Toco: Frequency: Every 1-5 minutes Labs: I have reviewed the patient's lab results.   Assessment & Plan:  [id number] @ [redacted]w[redacted]d, admitted for  Pregnancy and Labor/Delivery Management  1. Pain management: epidural. 2. FWB: FHT category I.  3. ID: GBS negative 4. Labor management: start pitocin- 1x1  All discussed with patient, see orders   Rod Can, [facility] 03/17/2020  4:43 PM

## 2020-03-18 ENCOUNTER — Encounter: Payer: Self-pay | Admitting: Advanced Practice Midwife

## 2020-03-18 DIAGNOSIS — O480 Post-term pregnancy: Secondary | ICD-10-CM

## 2020-03-18 DIAGNOSIS — Z3A40 40 weeks gestation of pregnancy: Secondary | ICD-10-CM

## 2020-03-18 MED ORDER — DIPHENHYDRAMINE HCL 25 MG PO CAPS: 25.0000 mg | ORAL_CAPSULE | Freq: Four times a day (QID) | ORAL | Status: DC | PRN

## 2020-03-18 MED ORDER — PRENATAL MULTIVITAMIN CH
1.0000 | ORAL_TABLET | Freq: Every day | ORAL | Status: DC
  Filled 2020-03-18: qty 1

## 2020-03-18 MED ORDER — COCONUT OIL OIL
1.0000 "application " | TOPICAL_OIL | Status: DC | PRN
  Filled 2020-03-18: qty 120

## 2020-03-18 MED ORDER — SENNOSIDES-DOCUSATE SODIUM 8.6-50 MG PO TABS: 2.0000 | ORAL_TABLET | ORAL | Status: DC

## 2020-03-18 MED ORDER — CITALOPRAM HYDROBROMIDE 20 MG PO TABS
20.0000 mg | ORAL_TABLET | Freq: Every day | ORAL | Status: DC
  Administered 2020-03-18: 20 mg via ORAL
  Filled 2020-03-18 (×3): qty 1

## 2020-03-18 MED ORDER — ONDANSETRON HCL 4 MG PO TABS: 4.0000 mg | ORAL_TABLET | ORAL | Status: DC | PRN

## 2020-03-18 MED ORDER — WITCH HAZEL-GLYCERIN EX PADS: 1.0000 "application " | MEDICATED_PAD | CUTANEOUS | Status: DC | PRN

## 2020-03-18 MED ORDER — DIBUCAINE (PERIANAL) 1 % EX OINT: 1.0000 "application " | TOPICAL_OINTMENT | CUTANEOUS | Status: DC | PRN

## 2020-03-18 MED ORDER — IBUPROFEN 600 MG PO TABS
600.0000 mg | ORAL_TABLET | Freq: Four times a day (QID) | ORAL | Status: DC
  Administered 2020-03-18 – 2020-03-19 (×4): 600 mg via ORAL
  Filled 2020-03-18 (×4): qty 1

## 2020-03-18 MED ORDER — TETANUS-DIPHTH-ACELL PERTUSSIS 5-2.5-18.5 LF-MCG/0.5 IM SUSY: 0.5000 mL | PREFILLED_SYRINGE | Freq: Once | INTRAMUSCULAR | Status: DC

## 2020-03-18 MED ORDER — ONDANSETRON HCL 4 MG/2ML IJ SOLN: 4.0000 mg | INTRAMUSCULAR | Status: DC | PRN

## 2020-03-18 MED ORDER — BENZOCAINE-MENTHOL 20-0.5 % EX AERO
1.0000 "application " | INHALATION_SPRAY | CUTANEOUS | Status: DC | PRN
  Filled 2020-03-18: qty 56

## 2020-03-18 MED ORDER — SIMETHICONE 80 MG PO CHEW: 80.0000 mg | CHEWABLE_TABLET | ORAL | Status: DC | PRN

## 2020-03-18 MED ORDER — ACETAMINOPHEN 325 MG PO TABS
650.0000 mg | ORAL_TABLET | ORAL | Status: DC | PRN
  Administered 2020-03-18 (×3): 650 mg via ORAL
  Filled 2020-03-18 (×3): qty 2

## 2020-03-18 NOTE — Progress Notes (Signed)
  Labor Progress Note   29 y.o. [id number] @ [redacted]w[redacted]d , admitted for  Pregnancy, Labor Management.   Subjective:  Having some right sided discomfort and laying right lateral  Objective:  BP 115/62 (BP Location: Right Arm)   Pulse 89   Temp 98.2 F (36.8 C) (Oral)   Resp 16   Ht 5\' 6"  (1.676 m)   Wt 93 kg   LMP 05/10/2019 (Approximate)   SpO2 96%   BMI 33.09 kg/m  Abd: gravid, ND, FHT present, mild tenderness on exam Extr: no edema SVE: CERVIX: 8.5 cm dilated, 90 effaced, -1 station, BBOW  EFM: FHR: 130 bpm, variability: moderate,  accelerations:  Present,  decelerations:  Present early decelerations Toco: Frequency: Every 2-3 minutes Labs: I have reviewed the patient's lab results.   Assessment & Plan:  [id number] @ [redacted]w[redacted]d, admitted for  Pregnancy and Labor/Delivery Management  1. Pain management: epidural. 2. FWB: FHT category II and overall reassuring.  3. ID: GBS negative 4. Labor management: continue pitocin, will AROM   All discussed with patient, see orders   Rod Can, [facility] 03/18/2020  7:12 AM

## 2020-03-18 NOTE — Discharge Summary (Signed)
OB Discharge Summary  Discharge summary date of update: 03/19/2020     Patient Name: Anna Woodard DOB: [birth year] MRN: [id number]  Date of admission: 03/17/2020 Delivering provider: Rod Can, CNM  Date of Delivery: 03/18/2020  Date of discharge: 03/19/2020  Admitting diagnosis: Encounter for elective induction of labor [Z34.90] Intrauterine pregnancy: [redacted]w[redacted]d     Secondary diagnosis: History of 3 LEEPs     Discharge diagnosis: Term Pregnancy Delivered                                                                                                Post partum procedures:none  Augmentation: Pitocin and Cytotec  Complications: None  Hospital course:  Induction of Labor With Vaginal Delivery   29 y.o. yo [id number] at [redacted]w[redacted]d was admitted to the hospital 03/17/2020 for induction of labor.  Indication for induction: Elective.  Patient had an uncomplicated labor course as follows: Membrane Rupture Time/Date: 7:24 AM ,03/18/2020   Delivery Method:Vaginal, Spontaneous  Episiotomy: None  Lacerations:  1st degree  Details of delivery can be found in separate delivery note.   Patient had a routine postpartum course.   Patient is discharged home 03/19/20.  Newborn Data: Birth date:03/18/2020  Birth time:7:54 AM  Gender:Female  Pearl Rosemary Living status:Living  Apgars:7 ,8  Weight: 3860 g, 8 pounds 8 ounces  Physical exam  Vitals:   03/18/20 1315 03/18/20 1553 03/18/20 2323 03/19/20 0757  BP: 123/83 122/83 126/77 120/90  Pulse: 85 90 88 74  Resp: 18 18 18 20   Temp: 98.3 F (36.8 C) 98 F (36.7 C) 98 F (36.7 C) 98.2 F (36.8 C)  TempSrc: Oral Oral Oral Oral  SpO2: 98%  95% 99%  Weight:      Height:       General: alert, cooperative and no distress Lochia: appropriate Uterine Fundus: firm Incision: N/A DVT Evaluation: No evidence of DVT seen on physical exam. Negative Homan's sign. No cords or calf tenderness.  Labs: Lab Results  Component Value Date   WBC 13.0 (H)  03/19/2020   HGB 11.1 (L) 03/19/2020   HCT 32.0 (L) 03/19/2020   MCV 88.4 03/19/2020   PLT 283 03/19/2020    Discharge instruction: per After Visit Summary.  Medications:  Allergies as of 03/19/2020      Reactions   Shellfish Allergy Anaphylaxis, Swelling, Other (See Comments)   Eyes swelling, thickening tongue   Zithromax [azithromycin] Anaphylaxis   Molds & Smuts    Other    See List scanned in to Media.   Penicillins       Medication List    TAKE these medications   acetaminophen 500 MG tablet Commonly known as: TYLENOL Take 1,000 mg by mouth every 6 (six) hours as needed for moderate pain or headache.   Advair Diskus 250-50 MCG/DOSE Aepb Generic drug: Fluticasone-Salmeterol Inhale 1 puff into the lungs 2 (two) times daily.   citalopram 20 MG tablet Commonly known as: CELEXA Take 20 mg by mouth daily.   EPINEPHrine 0.3 mg/0.3 mL Soaj injection Commonly known as: EPI-PEN Inject 0.3 mg into the muscle as needed.  famotidine 40 MG tablet Commonly known as: PEPCID Take 1 tablet by mouth daily.   levocetirizine 5 MG tablet Commonly known as: XYZAL Take 1 tablet by mouth daily.   montelukast 10 MG tablet Commonly known as: SINGULAIR Take 10 mg by mouth daily.   OVER THE COUNTER MEDICATION Take 1 tablet by mouth daily. [employer] prenatal vitamins       Diet: routine diet  Activity: Advance as tolerated. Pelvic rest for 6 weeks.   Outpatient follow up:  Follow-up Information    Rod Can, CNM. Schedule an appointment as soon as possible for a visit in 6 week(s).   Specialty: Obstetrics Why: postpartum follow up Contact information: [address] [place] [address] [phone]                 Postpartum contraception: Vasectomy Rhogam Given postpartum: NA Rubella vaccine given postpartum: no Varicella vaccine given postpartum: no TDaP given antepartum or postpartum: given antepartum    Newborn Delivery   Birth  date/time: 03/18/2020 07:54:00 Delivery type: Vaginal, Spontaneous       Baby Feeding: Breast  Disposition:home with mother  SIGNED:  Imagene Riches, CNM 03/19/2020 11:22 AM

## 2020-03-18 NOTE — Lactation Note (Signed)
This note was copied from a baby's chart. Lactation Consultation Note  Patient Name: Anna Woodard JSEGB'T Date: 03/18/2020 Reason for consult: Initial assessment;Term  Initial lactation visit. LC and Crystal student in room. Mom is a G4P2 who had SVD 4hrs ago and a noted first feeding in L&D. Mom BF her first (now 21yrs old) exclusively for 6 months, but did not specify reason for supplementation or change in exclusivity.  Baby at breast w/ [facility] team in room in modified cradle/laid-back on mom's right breast; baby initially coming on/off, taking breaks. LC spoke about positioning, deep latch, breaking suction if latch becomes painful- mom followed advice and removed and re-latched baby independently. Baby appeared deeper on the breast tissue with noticeable change in suck/shallow pattern with visual signs of swallowing that were spontaneous; these pointed out to both mom and dad.  [facility] student provided education on newborn stomach size, feeding patterns in first 24hrs, and output expectations. [facility] student reviewed early hunger cues, achieving optimal latch, and signs of transfer. Encouraged on demand feedings, and at least 8 attempts in first 24 hours.  Lactation information updated on the whiteboard, encouraged to call for ongoing BF assistance.  Maternal Data Formula Feeding for Exclusion: No Has patient been taught Hand Expression?: Yes Does the patient have breastfeeding experience prior to this delivery?: Yes  Feeding Feeding Type: Breast Fed  LATCH Score Latch: Grasps breast easily, tongue down, lips flanged, rhythmical sucking.  Audible Swallowing: Spontaneous and intermittent  Type of Nipple: Everted at rest and after stimulation  Comfort (Breast/Nipple): Soft / non-tender  Hold (Positioning): Assistance needed to correctly position infant at breast and maintain latch.  LATCH Score: 9  Interventions Interventions: Breast feeding basics reviewed;Assisted with latch;Hand  express;Support pillows;Position options  Lactation Tools Discussed/Used     Consult Status Consult Status: Follow-up Date: 03/18/20 Follow-up type: Call as needed    [name] 03/18/2020, 12:32 PM

## 2020-03-19 LAB — CBC
HCT: 32 % — ABNORMAL LOW (ref 36.0–46.0)
Hemoglobin: 11.1 g/dL — ABNORMAL LOW (ref 12.0–15.0)
MCH: 30.7 pg (ref 26.0–34.0)
MCHC: 34.7 g/dL (ref 30.0–36.0)
MCV: 88.4 fL (ref 80.0–100.0)
Platelets: 283 10*3/uL (ref 150–400)
RBC: 3.62 MIL/uL — ABNORMAL LOW (ref 3.87–5.11)
RDW: 13.7 % (ref 11.5–15.5)
WBC: 13 10*3/uL — ABNORMAL HIGH (ref 4.0–10.5)
nRBC: 0 % (ref 0.0–0.2)

## 2020-03-19 NOTE — Anesthesia Postprocedure Evaluation (Signed)
Anesthesia Post Note  Patient: Anna Woodard  Procedure(s) Performed: AN AD HOC LABOR EPIDURAL  Patient location during evaluation: Mother Baby Anesthesia Type: Epidural Level of consciousness: awake and alert Pain management: pain level controlled Vital Signs Assessment: post-procedure vital signs reviewed and stable Respiratory status: spontaneous breathing, nonlabored ventilation and respiratory function stable Cardiovascular status: stable Postop Assessment: no headache, no backache and epidural receding Anesthetic complications: no   No complications documented.   Last Vitals:  Vitals:   03/18/20 1553 03/18/20 2323  BP: 122/83 126/77  Pulse: 90 88  Resp: 18 18  Temp: 36.7 C 36.7 C  SpO2:  95%    Last Pain:  Vitals:   03/19/20 0421  TempSrc:   PainSc: 0-No pain                 Zsazsa Bahena B Clarisa [facility]

## 2020-03-19 NOTE — Discharge Instructions (Signed)

## 2020-03-19 NOTE — Progress Notes (Signed)
Patient discharged home with infant. Discharge instructions reviewed with patient. Patient verbalized understanding. Escorted out by auxillary.

## 2020-03-19 NOTE — Lactation Note (Signed)
This note was copied from a baby's chart. Lactation Consultation Note  Patient Name: Anna Woodard EQAST'M Date: 03/19/2020 Reason for consult: Follow-up assessment;Term  Lactation visit before discharge. Continuous good feedings reported by mom, some nipple tenderness/discomfort; coconut oil has been given. LC encouraged to continue encouraging baby to have a wide open mouth, sandwich tissue for deep latch and keep baby close throughout feeding. Yesterday, baby had a tendency to slip to tip of nipple and mom would allow her to stay.  [place] educated mom on differences between nutritive versus non-nutritive, downsides of prolonged suckling on the nipple tissue, and other comfort measures to try, without implementation of pacifier. Explained the impact of early introduction of pacifiers and artifical nipples may have on the establishment of breastfeeding. Reviewed newborn and BF basics for the days to come, encouraged to continue tracking output and feed with cues. Mom given direction for management of breast fullness and engorgement and when to seek MD care.  Information for outpatient lactation services and community breastfeeding support given and reviewed. Encouraged to call with questions/concerns and for ongoing BF support as needed.  Maternal Data Formula Feeding for Exclusion: No Has patient been taught Hand Expression?: Yes Does the patient have breastfeeding experience prior to this delivery?: Yes  Feeding Feeding Type: Breast Fed (left )  LATCH Score                   Interventions Interventions: Breast feeding basics reviewed  Lactation Tools Discussed/Used     Consult Status Consult Status: Complete Date: 03/19/20 Follow-up type: Call as needed    [name] 03/19/2020, 10:53 AM

## 2020-05-01 ENCOUNTER — Ambulatory Visit: Payer: Medicaid Other | Admitting: Advanced Practice Midwife

## 2020-05-16 ENCOUNTER — Encounter: Payer: Self-pay | Admitting: Advanced Practice Midwife

## 2020-05-16 ENCOUNTER — Other Ambulatory Visit: Payer: Self-pay

## 2020-05-16 ENCOUNTER — Ambulatory Visit (INDEPENDENT_AMBULATORY_CARE_PROVIDER_SITE_OTHER): Payer: Medicaid Other | Admitting: Advanced Practice Midwife

## 2020-05-16 DIAGNOSIS — F32A Depression, unspecified: Secondary | ICD-10-CM | POA: Insufficient documentation

## 2020-05-16 DIAGNOSIS — F419 Anxiety disorder, unspecified: Secondary | ICD-10-CM | POA: Insufficient documentation

## 2020-05-16 MED ORDER — CITALOPRAM HYDROBROMIDE 20 MG PO TABS: 30.0000 mg | ORAL_TABLET | Freq: Every day | ORAL | 11 refills | Status: DC

## 2020-05-16 NOTE — Progress Notes (Signed)
Postpartum Visit  Chief Complaint:  Chief Complaint  Patient presents with  . Post-op Follow-up    6 wk postpartum - no concerns. RM 4    History of Present Illness: Patient is a 30 y.o. [id number] presents for postpartum visit.   Review the Delivery Report for details.  Date of delivery: 03/18/2020 Type of delivery: Vaginal delivery - Vacuum or forceps assisted  no Episiotomy No.  Laceration: 1st degree hemostatic without repair  Pregnancy or labor problems:  no Any problems since the delivery:  no  Newborn Details:  SINGLETON :  1. BabyGender female. Birth weight: 8 pounds 8 ounces Maternal Details:  Breast or formula feeding: breastfeeding Intercourse: No  Contraception after delivery: plans vasectomy Any bowel or bladder issues: No  Post partum depression/anxiety noted:  Ongoing anxiety issues/worse since delivery- worried that something terrible will happen daily Edinburgh Post-Partum Depression Score: 4 Date of last PAP: 08/14/2019  no abnormalities    Review of Systems: Review of Systems  Constitutional: Negative for chills and fever.  HENT: Negative for congestion, ear discharge, ear pain, hearing loss, sinus pain and sore throat.   Eyes: Negative for blurred vision and double vision.  Respiratory: Negative for cough, shortness of breath and wheezing.   Cardiovascular: Negative for chest pain, palpitations and leg swelling.  Gastrointestinal: Negative for abdominal pain, blood in stool, constipation, diarrhea, heartburn, melena, nausea and vomiting.  Genitourinary: Negative for dysuria, flank pain, frequency, hematuria and urgency.  Musculoskeletal: Negative for back pain, joint pain and myalgias.  Skin: Negative for itching and rash.  Neurological: Negative for dizziness, tingling, tremors, sensory change, speech change, focal weakness, seizures, loss of consciousness, weakness and headaches.  Endo/Heme/Allergies: Negative for environmental allergies. Does not  bruise/bleed easily.  Psychiatric/Behavioral: Negative for depression, hallucinations, memory loss, substance abuse and suicidal ideas. The patient is not nervous/anxious and does not have insomnia.      Past Medical History:  Past Medical History:  Diagnosis Date  . Anxiety and depression   . 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 Complication of anesthesia    WAKES UP IN PANIC / CRYING  . Depression   . HPV (human papilloma virus) infection   . Panic disorder   . Panic disorder   . PTSD (post-traumatic stress disorder)     Past Surgical History:  Past Surgical History:  Procedure Laterality Date  . BARTHOLIN CYST MARSUPIALIZATION N/A 09/09/2017   Procedure: BARTHOLIN CYST MARSUPIALIZATION;  Surgeon: Will Bonnet, MD;  Location: [facility] ORS;  Service: Gynecology;  Laterality: N/A;  . CERVICAL BIOPSY  W/ LOOP ELECTRODE EXCISION      Family History:  Family History  Problem Relation Age of Onset  . Colon cancer Mother 8  . Hypertension Mother   . Stroke Mother   . Breast cancer Other 35  . Ovarian cancer Other 40    Social History:  Social History   Socioeconomic History  . Marital status: Married    Spouse name: Not on file  . Number of children: Not on file  . Years of education: Not on file  . Highest education level: Not on file  Occupational History  . Not on file  Tobacco Use  . Smoking status: Former Smoker    Quit date: 08/08/2017    Years since quitting: 2.7  . Smokeless tobacco: Never Used  Vaping Use  . Vaping Use: Every day  Substance and Sexual Activity  . Alcohol use: Yes    Comment: RARE  . Drug use: Not Currently    Types: Marijuana  . Sexual activity: Yes    Birth control/protection: Surgical    Comment: vasectomy  Other Topics Concern  . Not on file  Social History Narrative  . Not on file   Social Determinants of Health   Financial Resource Strain: Not on  file  Food Insecurity: Not on file  Transportation Needs: Not on file  Physical Activity: Not on file  Stress: Not on file  Social Connections: Not on file  Intimate Partner Violence: Not on file    Allergies:  Allergies  Allergen Reactions  . Shellfish Allergy Anaphylaxis, Swelling and Other (See Comments)    Eyes swelling, thickening tongue  . Zithromax [Azithromycin] Anaphylaxis  . Molds & Smuts   . Other     See List scanned in to Media.  Marland Kitchen Penicillins     Medications: Prior to Admission medications   Medication Sig Start Date End Date Taking? Authorizing Provider  acetaminophen (TYLENOL) 500 MG tablet Take 1,000 mg by mouth every 6 (six) hours as needed for moderate pain or headache.   Yes [provider]  ADVAIR DISKUS 250-50 MCG/DOSE AEPB Inhale 1 puff into the lungs 2 (two) times daily. 01/11/20  Yes [provider]  famotidine (PEPCID) 40 MG tablet Take 1 tablet by mouth daily. 08/28/19  Yes [provider]  levocetirizine (XYZAL) 5 MG tablet Take 1 tablet by mouth daily. 01/11/20  Yes [provider]  montelukast (SINGULAIR) 10 MG tablet Take 10 mg by mouth daily. 01/11/20  Yes [provider]  citalopram (CELEXA) 20 MG tablet Take 1.5 tablets (30 mg total) by mouth daily. 05/16/20   Rod Can, CNM  EPINEPHrine 0.3 mg/0.3 mL IJ SOAJ injection Inject 0.3 mg into the muscle as needed. 01/01/20   [provider]  OVER THE COUNTER MEDICATION Take 1 tablet by mouth daily. [place] prenatal vitamins    [provider]    Physical Exam Blood pressure 122/72, height 5\' 6"  (1.676 m), weight 188 lb (85.3 kg), currently breastfeeding.    General: NAD HEENT: normocephalic, anicteric Pulmonary: No increased work of breathing Abdomen: NABS, soft, non-tender, non-distended.  Umbilicus without lesions.  No hepatomegaly, splenomegaly or masses palpable. No evidence of hernia. Genitourinary:  External: Normal external  female genitalia.  Normal urethral meatus, normal Bartholin's and Skene's glands.    Vagina: Normal vaginal mucosa, no evidence of prolapse.    Cervix: Grossly normal in appearance, no bleeding, no CMT  Uterus: Non-enlarged, mobile, normal contour.    Adnexa: ovaries non-enlarged, no adnexal masses  Rectal: deferred Extremities: no edema, erythema, or tenderness Neurologic: Grossly intact Psychiatric: mood appropriate, affect full   Edinburgh Postnatal Depression Scale - 05/16/20 1337      Edinburgh Postnatal Depression Scale:  In the Past 7 Days   I have been able to laugh and see the funny side of things. 0    I have looked forward with enjoyment to things. 0    I have blamed myself unnecessarily when things went wrong. 0    I have been anxious or worried for no good reason. 2    I have felt scared or panicky for no good reason. 2    Things have been getting on top of me. 0    I have been so unhappy that I have had difficulty sleeping. 0  I have felt sad or miserable. 0    I have been so unhappy that I have been crying. 0    The thought of harming myself has occurred to me. 0    Edinburgh Postnatal Depression Scale Total 4           Assessment: 30 y.o. [id number] presenting for 6 week postpartum visit  Plan: Problem List Items Addressed This Visit      Other   Anxiety and depression   Relevant Medications   citalopram (CELEXA) 20 MG tablet    Other Visit Diagnoses    6 weeks postpartum follow-up    -  Primary   Relevant Medications   citalopram (CELEXA) 20 MG tablet       1) Contraception - Education given regarding options for contraception, as well as compatibility with breast feeding if applicable.  Patient plans on vasectomy for contraception.  2)  Pap - ASCCP guidelines and rational discussed.  ASCCP guidelines and rational discussed.  Patient opts for yearly screening interval for now given history of CIN/LEEP  3) Patient underwent screening for postpartum  depression with no signs of depression. Increase dose of Celexa to 30 mg daily.   4) Return in about 1 year (around 05/16/2021) for annual established gyn.   Rod Can, [facility] 05/16/2020, 2:08 PM

## 2020-06-13 ENCOUNTER — Other Ambulatory Visit: Payer: Self-pay

## 2020-06-13 DIAGNOSIS — F419 Anxiety disorder, unspecified: Secondary | ICD-10-CM

## 2020-06-13 MED ORDER — CITALOPRAM HYDROBROMIDE 20 MG PO TABS: 30.0000 mg | ORAL_TABLET | Freq: Every day | ORAL | 11 refills | Status: DC

## 2020-06-30 ENCOUNTER — Telehealth: Payer: Self-pay

## 2020-06-30 NOTE — Telephone Encounter (Signed)
Pt calling; JEG increased her meds b/c of anxiety; now is having insame physical panic response; continuously sweaty, shakey, neurosis, crawling in her own skin, lightheaded.  What to do?  [phone]  Courtesy call to pt to let her know JEG is on call today but msg will be sent to her.  Adv pt if she feels she cannot wait on JEG to go to ED.

## 2021-03-04 ENCOUNTER — Encounter: Payer: Self-pay | Admitting: Psychiatry

## 2021-03-04 ENCOUNTER — Ambulatory Visit (INDEPENDENT_AMBULATORY_CARE_PROVIDER_SITE_OTHER): Payer: 59 | Admitting: Psychiatry

## 2021-03-04 ENCOUNTER — Other Ambulatory Visit: Payer: Self-pay

## 2021-03-04 ENCOUNTER — Other Ambulatory Visit
Admission: RE | Admit: 2021-03-04 | Discharge: 2021-03-04 | Disposition: A | Discharge status: Home / Self Care | Payer: 59 | Attending: Psychiatry | Admitting: Psychiatry

## 2021-03-04 VITALS — BP 116/81 | HR 80 | Temp 98.7°F | Wt 156.2 lb

## 2021-03-04 DIAGNOSIS — Z9189 Other specified personal risk factors, not elsewhere classified: Secondary | ICD-10-CM | POA: Insufficient documentation

## 2021-03-04 DIAGNOSIS — F1611 Hallucinogen abuse, in remission: Secondary | ICD-10-CM

## 2021-03-04 DIAGNOSIS — F1020 Alcohol dependence, uncomplicated: Secondary | ICD-10-CM

## 2021-03-04 DIAGNOSIS — F1210 Cannabis abuse, uncomplicated: Secondary | ICD-10-CM | POA: Insufficient documentation

## 2021-03-04 DIAGNOSIS — F410 Panic disorder [episodic paroxysmal anxiety] without agoraphobia: Secondary | ICD-10-CM | POA: Diagnosis not present

## 2021-03-04 DIAGNOSIS — Z79899 Other long term (current) drug therapy: Secondary | ICD-10-CM | POA: Insufficient documentation

## 2021-03-04 DIAGNOSIS — F319 Bipolar disorder, unspecified: Secondary | ICD-10-CM

## 2021-03-04 DIAGNOSIS — F4310 Post-traumatic stress disorder, unspecified: Secondary | ICD-10-CM | POA: Insufficient documentation

## 2021-03-04 DIAGNOSIS — F1290 Cannabis use, unspecified, uncomplicated: Secondary | ICD-10-CM | POA: Insufficient documentation

## 2021-03-04 DIAGNOSIS — F3160 Bipolar disorder, current episode mixed, unspecified: Secondary | ICD-10-CM | POA: Insufficient documentation

## 2021-03-04 DIAGNOSIS — F17200 Nicotine dependence, unspecified, uncomplicated: Secondary | ICD-10-CM

## 2021-03-04 DIAGNOSIS — F1691 Hallucinogen use, unspecified, in remission: Secondary | ICD-10-CM

## 2021-03-04 LAB — LIPID PANEL
Cholesterol: 127 mg/dL (ref 0–200)
HDL: 46 mg/dL (ref >40)
LDL Cholesterol: 74 mg/dL (ref 0–99)
Total CHOL/HDL Ratio: 2.8 RATIO
Triglycerides: 33 mg/dL (ref <150)
VLDL: 7 mg/dL (ref 0–40)

## 2021-03-04 LAB — TSH: TSH: 0.508 u[IU]/mL (ref 0.350–4.500)

## 2021-03-04 LAB — CBC WITH DIFFERENTIAL/PLATELET
Abs Immature Granulocytes: 0.02 10*3/uL (ref 0.00–0.07)
Basophils Absolute: 0 10*3/uL (ref 0.0–0.1)
Basophils Relative: 0 %
Eosinophils Absolute: 0.2 10*3/uL (ref 0.0–0.5)
Eosinophils Relative: 2 %
HCT: 39.5 % (ref 36.0–46.0)
Hemoglobin: 14.3 g/dL (ref 12.0–15.0)
Immature Granulocytes: 0 %
Lymphocytes Relative: 26 %
Lymphs Abs: 2.5 10*3/uL (ref 0.7–4.0)
MCH: 33.3 pg (ref 26.0–34.0)
MCHC: 36.2 g/dL — ABNORMAL HIGH (ref 30.0–36.0)
MCV: 91.9 fL (ref 80.0–100.0)
Monocytes Absolute: 0.6 10*3/uL (ref 0.1–1.0)
Monocytes Relative: 6 %
Neutro Abs: 6.2 10*3/uL (ref 1.7–7.7)
Neutrophils Relative %: 66 %
Platelets: 323 10*3/uL (ref 150–400)
RBC: 4.3 MIL/uL (ref 3.87–5.11)
RDW: 12.7 % (ref 11.5–15.5)
WBC: 9.4 10*3/uL (ref 4.0–10.5)
nRBC: 0 % (ref 0.0–0.2)

## 2021-03-04 LAB — COMPREHENSIVE METABOLIC PANEL
ALT: 15 U/L (ref 0–44)
AST: 13 U/L — ABNORMAL LOW (ref 15–41)
Albumin: 4.2 g/dL (ref 3.5–5.0)
Alkaline Phosphatase: 60 U/L (ref 38–126)
Anion gap: 5 (ref 5–15)
BUN: 5 mg/dL — ABNORMAL LOW (ref 6–20)
CO2: 26 mmol/L (ref 22–32)
Calcium: 9.1 mg/dL (ref 8.9–10.3)
Chloride: 107 mmol/L (ref 98–111)
Creatinine, Ser: 0.76 mg/dL (ref 0.44–1.00)
GFR, Estimated: >60 mL/min (ref >60)
Glucose, Bld: 94 mg/dL (ref 70–99)
Potassium: 3.8 mmol/L (ref 3.5–5.1)
Sodium: 138 mmol/L (ref 135–145)
Total Bilirubin: 0.7 mg/dL (ref 0.3–1.2)
Total Protein: 6.7 g/dL (ref 6.5–8.1)

## 2021-03-04 LAB — HEMOGLOBIN A1C
Hgb A1c MFr Bld: 5.1 % (ref 4.8–5.6)
Mean Plasma Glucose: 100 mg/dL

## 2021-03-04 MED ORDER — QUETIAPINE FUMARATE 25 MG PO TABS: 25.0000 mg | ORAL_TABLET | Freq: Every day | ORAL | 0 refills | Status: DC
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
## 2021-03-04 NOTE — Patient Instructions (Addendum)
Quetiapine Tablets What is this medication? QUETIAPINE (kwe TYE a peen) treats schizophrenia and bipolar disorder. It works by balancing the levels of dopamine and serotonin in your brain, hormones that help regulate mood, behaviors, and thoughts. It belongs to a group of medications called antipsychotics. Antipsychotic medications can be used to treat several kinds of mental health conditions. This medicine may be used for other purposes; ask your health care provider or pharmacist if you have questions. COMMON BRAND NAME(S): Seroquel What should I tell my care team before I take this medication? They need to know if you have any of these conditions: Blockage in your bowel Cataracts Constipation Dementia Diabetes Difficulty swallowing Glaucoma Heart disease High levels of prolactin History of breast cancer History of irregular heartbeat Liver disease Low blood counts, like low white cell, platelet, or red cell counts Low blood pressure Parkinson's disease Prostate disease Seizures Suicidal thoughts, plans or attempt; a previous suicide attempt by you or a family member Thyroid disease Trouble passing urine An unusual or allergic reaction to quetiapine, other medications, foods, dyes, or preservatives Pregnant or trying to get pregnant Breast-feeding How should I use this medication? Take this medication by mouth. Swallow it with a drink of water. Follow the directions on the prescription label. If it upsets your stomach you can take it with food. Take your medication at regular intervals. Do not take it more often than directed. Do not stop taking except on the advice of your care team. A special MedGuide will be given to you by the pharmacist with each prescription and refill. Be sure to read this information carefully each time. Talk to your care team about the use of this medication in children. While this medication may be prescribed for children as young as 10 years for  selected conditions, precautions do apply. Patients over age 76 years may have a stronger reaction to this medication and need smaller doses. Overdosage: If you think you have taken too much of this medicine contact a poison control center or emergency room at once. NOTE: This medicine is only for you. Do not share this medicine with others. What if I miss a dose? If you miss a dose, take it as soon as you can. If it is almost time for your next dose, take only that dose. Do not take double or extra doses. What may interact with this medication? Do not take this medication with any of the following: Cisapride Dronedarone Metoclopramide Pimozide Thioridazine This medication may also interact with the following: Alcohol Antihistamines for allergy, cough, and cold Atropine Avasimibe Certain antivirals for HIV or hepatitis Certain medications for anxiety or sleep Certain medications for bladder problems like oxybutynin, tolterodine Certain medications for depression like amitriptyline, fluoxetine, nefazodone, sertraline Certain medications for fungal infections like fluconazole, ketoconazole, itraconazole, posaconazole Certain medications for stomach problems like dicyclomine, hyoscyamine Certain medications for travel sickness like scopolamine Cimetidine General anesthetics like halothane, isoflurane, methoxyflurane, propofol Ipratropium Levodopa or other medications for Parkinson's disease Medications for blood pressure Medications for seizures Medications that relax muscles for surgery Narcotic medications for pain Other medications that prolong the QT interval (cause an abnormal heart rhythm) Phenothiazines like chlorpromazine, prochlorperazine Rifampin St. John's Wort This list may not describe all possible interactions. Give your health care provider a list of all the medicines, herbs, non-prescription drugs, or dietary supplements you use. Also tell them if you smoke, drink  alcohol, or use illegal drugs. Some items may interact with your medicine. What should I watch for  while using this medication? Visit your care team for regular checks on your progress. Tell your care team if symptoms do not start to get better or if they get worse. Do not stop taking except on your care team's advice. You may develop a severe reaction. Your care team will tell you how much medication to take. You may need to have an eye exam before and during use of this medication. This medication may increase blood sugar. Ask your care team if changes in diet or medications are needed if you have diabetes. Patients and their families should watch out for new or worsening depression or thoughts of suicide. Also watch out for sudden or severe changes in feelings such as feeling anxious, agitated, panicky, irritable, hostile, aggressive, impulsive, severely restless, overly excited and hyperactive, or not being able to sleep. If this happens, especially at the beginning of antidepressant treatment or after a change in dose, call your care team. You may get dizzy or drowsy. Do not drive, use machinery, or do anything that needs mental alertness until you know how this medication affects you. Do not stand or sit up quickly, especially if you are an older patient. This reduces the risk of dizzy or fainting spells. Alcohol may interfere with the effect of this medication. Avoid alcoholic drinks. This medication can cause problems with controlling your body temperature. It can lower the response of your body to cold temperatures. If possible, stay indoors during cold weather. If you must go outdoors, wear warm clothes. It can also lower the response of your body to heat. Do not overheat. Do not over-exercise. Stay out of the sun when possible. If you must be in the sun, wear cool clothing. Drink plenty of water. If you have trouble controlling your body temperature, call your care team right away. What side  effects may I notice from receiving this medication? Side effects that you should report to your care team as soon as possible: Allergic reactions-skin rash, itching, hives, swelling of the face, lips, tongue, or throat Heart rhythm changes-fast or irregular heartbeat, dizziness, feeling faint or lightheaded, chest pain, trouble breathing High blood sugar (hyperglycemia)-increased thirst or amount of urine, unusual weakness or fatigue, blurry vision High fever, stiff muscles, increased sweating, fast or irregular heartbeat, and confusion, which may be signs of neuroleptic malignant syndrome High prolactin level-unexpected breast tissue growth, discharge from the nipple, change in sex drive or performance, irregular menstrual cycle Increase in blood pressure in children Infection-fever, chills, cough, or sore throat Low blood pressure-dizziness, feeling faint or lightheaded, blurry vision Low thyroid levels (hypothyroidism)-unusual weakness or fatigue, increased sensitivity to cold, constipation, hair loss, dry skin, weight gain, feelings of depression Pain or trouble swallowing Seizures Stroke-sudden numbness or weakness of the face, arm, or leg, trouble speaking, confusion, trouble walking, loss of balance or coordination, dizziness, severe headache, change in vision Sudden eye pain or change in vision such as blurry vision, seeing halos around lights, vision loss Thoughts of suicide or self-harm, worsening mood, feelings of depression Trouble passing urine Uncontrolled and repetitive body movements, muscle stiffness or spasms, tremors or shaking, loss of balance or coordination, restlessness, shuffling walk, which may be signs of extrapyramidal symptoms (EPS) Side effects that usually do not require medical attention (report to your care team if they continue or are bothersome): Constipation Dizziness Drowsiness Dry mouth Weight gain This list may not describe all possible side effects.  Call your doctor for medical advice about side effects. You may report side  effects to FDA at 1-800-FDA-1088. Where should I keep my medication? Keep out of the reach of children. Store at room temperature between 15 and 30 degrees C (59 and 86 degrees F). Throw away any unused medication after the expiration date. NOTE: This sheet is a summary. It may not cover all possible information. If you have questions about this medicine, talk to your doctor, pharmacist, or health care provider.  2022 [employer] (2020-08-07 12:13:06)   PLEASE CALL [phone] FOR EKG

## 2021-03-05 ENCOUNTER — Encounter: Payer: Self-pay | Admitting: Psychiatry

## 2021-03-05 LAB — PROLACTIN: Prolactin: 5.1 ng/mL (ref 4.8–23.3)

## 2021-03-06 ENCOUNTER — Other Ambulatory Visit: Payer: Self-pay

## 2021-03-06 ENCOUNTER — Ambulatory Visit
Admission: RE | Admit: 2021-03-06 | Discharge: 2021-03-06 | Disposition: A | Discharge status: Home / Self Care | Payer: 59 | Source: Ambulatory Visit | Attending: Psychiatry | Admitting: Psychiatry

## 2021-03-06 DIAGNOSIS — Z5181 Encounter for therapeutic drug level monitoring: Secondary | ICD-10-CM | POA: Diagnosis not present

## 2021-03-06 DIAGNOSIS — F4310 Post-traumatic stress disorder, unspecified: Secondary | ICD-10-CM | POA: Diagnosis not present

## 2021-03-06 DIAGNOSIS — Z9189 Other specified personal risk factors, not elsewhere classified: Secondary | ICD-10-CM | POA: Diagnosis not present

## 2021-03-06 DIAGNOSIS — F319 Bipolar disorder, unspecified: Secondary | ICD-10-CM | POA: Insufficient documentation

## 2021-03-09 ENCOUNTER — Encounter: Payer: Self-pay | Admitting: Psychology

## 2021-03-11 ENCOUNTER — Ambulatory Visit (INDEPENDENT_AMBULATORY_CARE_PROVIDER_SITE_OTHER): Payer: 59 | Admitting: Psychiatry

## 2021-03-11 ENCOUNTER — Other Ambulatory Visit: Payer: Self-pay

## 2021-03-11 ENCOUNTER — Encounter: Payer: Self-pay | Admitting: Psychiatry

## 2021-03-11 VITALS — BP 133/82 | HR 94 | Temp 98.5°F | Wt 153.8 lb

## 2021-03-11 DIAGNOSIS — F319 Bipolar disorder, unspecified: Secondary | ICD-10-CM

## 2021-03-11 DIAGNOSIS — F1020 Alcohol dependence, uncomplicated: Secondary | ICD-10-CM | POA: Diagnosis not present

## 2021-03-11 DIAGNOSIS — F4310 Post-traumatic stress disorder, unspecified: Secondary | ICD-10-CM | POA: Diagnosis not present

## 2021-03-11 DIAGNOSIS — F410 Panic disorder [episodic paroxysmal anxiety] without agoraphobia: Secondary | ICD-10-CM

## 2021-03-11 DIAGNOSIS — F17200 Nicotine dependence, unspecified, uncomplicated: Secondary | ICD-10-CM

## 2021-03-11 DIAGNOSIS — F1691 Hallucinogen use, unspecified, in remission: Secondary | ICD-10-CM

## 2021-03-11 DIAGNOSIS — F1290 Cannabis use, unspecified, uncomplicated: Secondary | ICD-10-CM

## 2021-03-11 DIAGNOSIS — F1611 Hallucinogen abuse, in remission: Secondary | ICD-10-CM

## 2021-03-11 DIAGNOSIS — Z79899 Other long term (current) drug therapy: Secondary | ICD-10-CM

## 2021-03-11 MED ORDER — PROPRANOLOL HCL 20 MG PO TABS: 20.0000 mg | ORAL_TABLET | Freq: Every day | ORAL | 1 refills | Status: DC | PRN

## 2021-03-11 MED ORDER — HYDROXYZINE PAMOATE 25 MG PO CAPS: 25.0000 mg | ORAL_CAPSULE | Freq: Two times a day (BID) | ORAL | 1 refills | Status: DC | PRN

## 2021-03-11 MED ORDER — QUETIAPINE FUMARATE 50 MG PO TABS
50.0000 mg | ORAL_TABLET | Freq: Every day | ORAL | 0 refills | Status: DC
Start: 2021-03-11 — End: 2021-04-28

## 2021-03-11 NOTE — Patient Instructions (Signed)
Hydroxyzine Capsules or Tablets What is this medication? HYDROXYZINE (hye DROX i zeen) treats the symptoms of allergies and allergic reactions. It may also be used to treat anxiety or cause drowsiness before a procedure. It works by blocking histamine, a substance released by the body during an allergic reaction. It belongs to a group of medications called antihistamines. This medicine may be used for other purposes; ask your health care provider or pharmacist if you have questions. COMMON BRAND NAME(S): ANX, Atarax, Rezine, Vistaril What should I tell my care team before I take this medication? They need to know if you have any of these conditions: Glaucoma Heart disease History of irregular heartbeat Kidney disease Liver disease Lung or breathing disease, like asthma Stomach or intestine problems Thyroid disease Trouble passing urine An unusual or allergic reaction to hydroxyzine, cetirizine, other medications, foods, dyes or preservatives Pregnant or trying to get pregnant Breast-feeding How should I use this medication? Take this medication by mouth with a full glass of water. Follow the directions on the prescription label. You may take this medication with food or on an empty stomach. Take your medication at regular intervals. Do not take your medication more often than directed. Talk to your care team regarding the use of this medication in children. Special care may be needed. While this medication may be prescribed for children as young as 6 years of age for selected conditions, precautions do apply. Patients over 65 years old may have a stronger reaction and need a smaller dose. Overdosage: If you think you have taken too much of this medicine contact a poison control center or emergency room at once. NOTE: This medicine is only for you. Do not share this medicine with others. What if I miss a dose? If you miss a dose, take it as soon as you can. If it is almost time for your next  dose, take only that dose. Do not take double or extra doses. What may interact with this medication? Do not take this medication with any of the following: Cisapride Dronedarone Pimozide Thioridazine This medication may also interact with the following: Alcohol Antihistamines for allergy, cough, and cold Atropine Barbiturate medications for sleep or seizures, like phenobarbital Certain antibiotics like erythromycin or clarithromycin Certain medications for anxiety or sleep Certain medications for bladder problems like oxybutynin, tolterodine Certain medications for depression or psychotic disturbances Certain medications for irregular heart beat Certain medications for Parkinson's disease like benztropine, trihexyphenidyl Certain medications for seizures like phenobarbital, primidone Certain medications for stomach problems like dicyclomine, hyoscyamine Certain medications for travel sickness like scopolamine Ipratropium Narcotic medications for pain Other medications that prolong the QT interval (which can cause an abnormal heart rhythm) like dofetilide This list may not describe all possible interactions. Give your health care provider a list of all the medicines, herbs, non-prescription drugs, or dietary supplements you use. Also tell them if you smoke, drink alcohol, or use illegal drugs. Some items may interact with your medicine. What should I watch for while using this medication? Tell your care team if your symptoms do not improve. You may get drowsy or dizzy. Do not drive, use machinery, or do anything that needs mental alertness until you know how this medication affects you. Do not stand or sit up quickly, especially if you are an older patient. This reduces the risk of dizzy or fainting spells. Alcohol may interfere with the effect of this medication. Avoid alcoholic drinks. Your mouth may get dry. Chewing sugarless gum or sucking hard   candy, and drinking plenty of water may  help. Contact your care team if the problem does not go away or is severe. This medication may cause dry eyes and blurred vision. If you wear contact lenses you may feel some discomfort. Lubricating drops may help. See your eye care specialist if the problem does not go away or is severe. If you are receiving skin tests for allergies, tell your care team you are using this medication. What side effects may I notice from receiving this medication? Side effects that you should report to your care team as soon as possible: Allergic reactions-skin rash, itching, hives, swelling of the face, lips, tongue, or throat Heart rhythm changes-fast or irregular heartbeat, dizziness, feeling faint or lightheaded, chest pain, trouble breathing Side effects that usually do not require medical attention (report to your care team if they continue or are bothersome): Confusion Drowsiness Dry mouth Hallucinations Headache This list may not describe all possible side effects. Call your doctor for medical advice about side effects. You may report side effects to FDA at 1-800-FDA-1088. Where should I keep my medication? Keep out of the reach of children and pets. Store at room temperature between 15 and 30 degrees C (59 and 86 degrees F). Keep container tightly closed. Throw away any unused medication after the expiration date. NOTE: This sheet is a summary. It may not cover all possible information. If you have questions about this medicine, talk to your doctor, pharmacist, or health care provider.  2022 [employer] (2020-07-08 15:19:25)  

## 2021-03-11 NOTE — Progress Notes (Signed)
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  Patient today reports since being on the Seroquel her mood symptoms have improved.  Her racing thoughts have improved to the point that she feels calmer and is able to focus on her day-to-day activities better than before.  When she moves from one room to the other she is able to remember and complete her chores without forgetting as she used to before.  She reports she is not talking to herself looking at the mirror anymore.  The Seroquel has made a difference.  She denies side effects.  Sleep is better although she continues to struggle.  She continues to have nightmares likely due to her history of trauma.  Patient does report on and off panic attacks, chest pain this morning.  Patient reports she typically gets chest pain when anxious.  Denies any history of cardiac problems.  She is interested in psychotherapy sessions and has upcoming appointment scheduled.  Patient continues to use alcohol, reports however she is trying to cut back.  She is also interested in cutting back on the cannabis.  Patient denies any suicidality or homicidality or perceptual disturbances.  Reviewed and discussed most recent labs with patient.    Visit Diagnosis:    ICD-10-CM   1. PTSD (post-traumatic stress disorder)  F43.10 QUEtiapine (SEROQUEL) 50 MG tablet    2. Bipolar and related disorder (HCC)  F31.9 QUEtiapine (SEROQUEL) 50 MG tablet    3.  Panic disorder  F41.0 hydrOXYzine (VISTARIL) 25 MG capsule    DISCONTINUED: propranolol (INDERAL) 20 MG tablet    4. Alcohol use disorder, moderate, dependence (HCC)  F10.20     5. Cannabis use disorder  F12.90    unspecified    6. Tobacco use disorder  F17.200     7. 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 High risk medication use  Z79.899       Past Psychiatric History: Reviewed past psychiatric history from progress note on 03/04/2021.  Past trials of medications like Wellbutrin, Xanax, Valium, Prozac  Past Medical History:  Past Medical History:  Diagnosis Date   Anxiety and depression    Cancer ([place])    CERBVICAL   CIN III (cervical intraepithelial neoplasia grade III) with severe dysplasia    Complication of anesthesia    WAKES UP IN PANIC / CRYING   Depression    HPV (human papilloma virus) infection    Panic disorder    Panic disorder    PTSD (post-traumatic stress disorder)     Past Surgical History:  Procedure Laterality Date   BARTHOLIN CYST MARSUPIALIZATION N/A 09/09/2017   Procedure: BARTHOLIN CYST MARSUPIALIZATION;  Surgeon: Will Bonnet, MD;  Location: [facility] ORS;  Service: Gynecology;  Laterality: N/A;   CERVICAL BIOPSY  W/ LOOP ELECTRODE EXCISION     DENTAL SURGERY      Family Psychiatric History: Reviewed family psychiatric history from progress note on 03/04/2021  Family  History:  Family History  Problem Relation Age of Onset   Drug abuse Mother    Alcohol abuse Mother    Colon cancer Mother 42   Hypertension Mother    Stroke Mother    Alcohol abuse Father    Mental illness Father    Bipolar disorder Maternal Aunt    Mental illness Paternal Aunt    Mental illness Paternal Uncle    Suicidality Paternal Uncle    Seizures Maternal Grandmother    Breast cancer Other 71   Ovarian cancer Other 62   Suicidality Maternal Great-grandmother     Social History:  Reviewed social history from progress note on 03/04/2021 Social History   Socioeconomic History   Marital status: Married    Spouse name: anthony   Number of children: 2   Years of education: Not on file   Highest education level: Associate degree: occupational, Hotel manager, or vocational program  Occupational History   Not on file  Tobacco Use   Smoking status: Every Day    Packs/day: 0.50    Years: 15.00    Pack years: 7.50    Types: Cigarettes    Last attempt to quit: 08/08/2017    Years since quitting: 3.5   Smokeless tobacco: Never  Vaping Use   Vaping Use: Every day  Substance and Sexual Activity   Alcohol use: Not Currently    Comment: RARE   Drug use: Yes    Types: Marijuana   Sexual activity: Yes    Birth control/protection: Surgical    Comment: vasectomy  Other Topics Concern   Not on file  Social History Narrative   Not on file   Social Determinants of Health   Financial Resource Strain: Not on file  Food Insecurity: Not on file  Transportation Needs: Not on file  Physical Activity: Not on file  Stress: Not on file  Social Connections: Not on file    Allergies:  Allergies  Allergen Reactions   Shellfish Allergy Anaphylaxis, Swelling and Other (See Comments)    Eyes swelling, thickening tongue   Zithromax [Azithromycin] Anaphylaxis   Molds & Smuts    Naproxen    Other     See List scanned in to Media.   Penicillins     Metabolic Disorder Labs: Lab Results  Component Value Date   HGBA1C 5.1 03/04/2021   MPG 100 03/04/2021   Lab Results  Component Value Date   PROLACTIN 5.1 03/04/2021   Lab Results  Component Value Date   CHOL 127 03/04/2021   TRIG 33 03/04/2021   HDL 46 03/04/2021   CHOLHDL 2.8 03/04/2021   VLDL 7 03/04/2021   LDLCALC 74 03/04/2021   Lab Results  Component Value Date   TSH 0.508 03/04/2021    Therapeutic Level Labs: No results found for: LITHIUM No results found for: VALPROATE No components found for:   CBMZ  Current Medications: Current Outpatient Medications  Medication Sig Dispense Refill   acetaminophen (TYLENOL) 500 MG tablet Take 1,000 mg by mouth every 6 (six) hours as needed for moderate pain or headache.     ADVAIR DISKUS 250-50 MCG/DOSE AEPB Inhale 1 puff into the lungs 2 (two) times daily.     albuterol (VENTOLIN HFA) 108 (90 Base) MCG/ACT inhaler ProAir HFA 90 mcg/actuation aerosol inhaler  INHALE 1 TO 2 PUFFS BY MOUTH EVERY 4 TO 6 HOURS AS NEEDED FOR COUGH OR WHEEZING     citalopram (CELEXA) 20 MG tablet Take 1.5 tablets (30 mg total)  by mouth daily. 45 tablet 11   EPINEPHrine 0.3 mg/0.3 mL IJ SOAJ injection Inject 0.3 mg into the muscle as needed.     famotidine (PEPCID) 40 MG tablet Take 1 tablet by mouth daily.     hydrOXYzine (VISTARIL) 25 MG capsule Take 1 capsule (25 mg total) by mouth 2 (two) times daily as needed for anxiety. Severe anxiety attacks 60 capsule 1   levocetirizine (XYZAL) 5 MG tablet Take 1 tablet by mouth daily.     LORazepam (ATIVAN) 0.5 MG tablet Take 0.5 mg by mouth daily as needed.     montelukast (SINGULAIR) 10 MG tablet Take 10 mg by mouth daily.     QUEtiapine (SEROQUEL) 50 MG tablet Take 1 tablet (50 mg total) by mouth at bedtime. 30 tablet 0   No current facility-administered medications for this visit.     Musculoskeletal: Strength & Muscle Tone: within normal limits Gait & Station: normal Patient leans: N/A  Psychiatric Specialty Exam: Review of Systems  Psychiatric/Behavioral:  Positive for decreased concentration, dysphoric mood and sleep disturbance. The patient is nervous/anxious.   All other systems reviewed and are negative.  Blood pressure 133/82, pulse 94, temperature 98.5 F (36.9 C), temperature source Temporal, weight 153 lb 12.8 oz (69.8 kg), not currently breastfeeding.Body mass index is 24.82 kg/m.  General Appearance: Casual  Eye Contact:  Fair  Speech:  Clear and Coherent  Volume:  Normal  Mood:  Anxious and mood  swings - improving  Affect:  Congruent  Thought Process:  Goal Directed and Descriptions of Associations: Intact  Orientation:  Full (Time, Place, and Person)  Thought Content: Logical   Suicidal Thoughts:  No  Homicidal Thoughts:  No  Memory:  Immediate;   Fair Recent;   Fair Remote;   Fair  Judgement:  Fair  Insight:  Fair  Psychomotor Activity:  Normal  Concentration:  Concentration: Fair and Attention Span: Fair  Recall:  [employer] of Knowledge: Fair  Language: Fair  Akathisia:  No  Handed:  Right  AIMS (if indicated): done,0  Assets:  Communication Skills [employer] Talents/Skills Transportation  ADL's:  Intact  Cognition: WNL  Sleep:   improving , has nightmares   Screenings: AUDIT    Flowsheet Row Office Visit from 03/04/2021 in [facility]  Alcohol Use Disorder Identification Test Final Score (AUDIT) 22      GAD-7    Flowsheet Row Office Visit from 03/04/2021 in [facility]  Total GAD-7 Score 21      PHQ2-9    [place] Visit from 03/11/2021 in [facility] Visit from 03/04/2021 in [facility]  PHQ-2 Total Score 4 1  PHQ-9 Total Score 17 [place] Office Visit from 03/11/2021 in [facility] Office Visit from 03/04/2021 in [facility] No Risk No Risk        Assessment and Plan: Junice Fei is a 30 year old Caucasian female, has a history of PTSD, bipolar and related disorder unspecified, cannabis, alcohol use disorder, history of hallucinogen abuse, MDMA abuse, tobacco use disorder was evaluated in office today.  Patient continues to struggle with mood and sleep although she is responding to the Seroquel, will benefit from medication readjustment and psychotherapy sessions.  Plan  Bipolar  and related disorder, unspecified-unstable Unknown if substance-induced due to her extensive history of substance abuse as noted  in H&P. Increase Seroquel to 50 mg p.o. nightly  PTSD-unstable She will benefit from psychotherapy session has upcoming appointment with her therapist. Celexa 30 mg p.o. daily Seroquel dosage has been increased as well.  Panic disorder-unstable Celexa 30 mg p.o. daily Increase Seroquel to 50 mg p.o. nightly Add hydroxyzine 25 mg p.o. twice daily as needed for severe anxiety attacks Will need CBT. Patient was offered referral to IOP in the past-she declined. Patient is not a good candidate for benzodiazepine therapy.  Alcohol use disorder-improving Provided counseling  Cannabis use disorder-unstable Provided counseling  Tobacco use disorder-unstable Will monitor closely  Reviewed and discussed labs dated 03/04/2021-hemoglobin A1c-5.1-within normal limits, lipid panel-within normal limits, TSH-within normal limits, CMP and CBC with differential-within normal limits.  Urine drug screen-pending  Reviewed and discussed EKG 03/04/2021-left atrial enlargement, normal sinus rhythm.  Follow-up in clinic in 3 weeks or sooner in person.  This note was generated in part or whole with voice recognition software. Voice recognition is usually quite accurate but there are transcription errors that can and very often do occur. I apologize for any typographical errors that were not detected and corrected.        Ursula Alert, MD 03/12/2021, 8:58 AM

## 2021-03-13 LAB — URINE DRUGS OF ABUSE SCREEN W ALC, ROUTINE (REF LAB)
Amphetamines, Urine: NEGATIVE ng/mL
Barbiturate, Ur: NEGATIVE ng/mL
Benzodiazepine Quant, Ur: NEGATIVE ng/mL
Cocaine (Metab.): NEGATIVE ng/mL
Ethanol U, Quan: NEGATIVE %
Methadone Screen, Urine: NEGATIVE ng/mL
Opiate Quant, Ur: NEGATIVE ng/mL
Phencyclidine, Ur: NEGATIVE ng/mL
Propoxyphene, Urine: NEGATIVE ng/mL

## 2021-03-13 LAB — PANEL 799049
CARBOXY THC GC/MS CONF: 2125 ng/mL
Cannabinoid GC/MS, Ur: POSITIVE — AB

## 2021-04-01 ENCOUNTER — Encounter: Payer: Self-pay | Admitting: Psychiatry

## 2021-04-01 ENCOUNTER — Ambulatory Visit (INDEPENDENT_AMBULATORY_CARE_PROVIDER_SITE_OTHER): Payer: 59 | Admitting: Psychiatry

## 2021-04-01 ENCOUNTER — Other Ambulatory Visit: Payer: Self-pay

## 2021-04-01 VITALS — BP 110/77 | HR 98 | Temp 98.7°F | Wt 152.0 lb

## 2021-04-01 DIAGNOSIS — F410 Panic disorder [episodic paroxysmal anxiety] without agoraphobia: Secondary | ICD-10-CM

## 2021-04-01 DIAGNOSIS — F319 Bipolar disorder, unspecified: Secondary | ICD-10-CM | POA: Diagnosis not present

## 2021-04-01 DIAGNOSIS — F4310 Post-traumatic stress disorder, unspecified: Secondary | ICD-10-CM

## 2021-04-01 DIAGNOSIS — F1611 Hallucinogen abuse, in remission: Secondary | ICD-10-CM

## 2021-04-01 DIAGNOSIS — F1691 Hallucinogen use, unspecified, in remission: Secondary | ICD-10-CM

## 2021-04-01 DIAGNOSIS — F17200 Nicotine dependence, unspecified, uncomplicated: Secondary | ICD-10-CM

## 2021-04-01 DIAGNOSIS — F1210 Cannabis abuse, uncomplicated: Secondary | ICD-10-CM

## 2021-04-01 DIAGNOSIS — F1021 Alcohol dependence, in remission: Secondary | ICD-10-CM

## 2021-04-01 MED ORDER — PAROXETINE HCL ER 12.5 MG PO TB24
12.5000 mg | ORAL_TABLET | Freq: Every day | ORAL | 0 refills | Status: DC
Start: 2021-04-01 — End: 2021-04-28

## 2021-04-01 MED ORDER — CITALOPRAM HYDROBROMIDE 20 MG PO TABS: 10.0000 mg | ORAL_TABLET | Freq: Every day | ORAL | 0 refills | Status: DC

## 2021-04-01 NOTE — Patient Instructions (Signed)
Paroxetine Controlled-Release Tablets What is this medication? PAROXETINE (pa ROX e teen) treats depression, anxiety, obsessive-compulsive disorder (OCD), post-traumatic stress disorder (PTSD), and premenstrual dysphoric disorder (PMDD). It increases the amount of serotonin in the brain, a hormone that helps regulate mood. It belongs to a group of medications called SSRIs. This medicine may be used for other purposes; ask your health care provider or pharmacist if you have questions. COMMON BRAND NAME(S): Paxil CR What should I tell my care team before I take this medication? They need to know if you have any of these conditions: Bipolar disorder or a family history of bipolar disorder Bleeding disorders Glaucoma Heart disease Kidney disease Liver disease Low levels of sodium in the blood Seizures Suicidal thoughts, plans, or attempt; a previous suicide attempt by you or a family member Take MAOIs like Carbex, Eldepryl, Marplan, Nardil, and Parnate Take medications that treat or prevent blood clots Thyroid disease An unusual or allergic reaction to paroxetine, other medications, foods, dyes, or preservatives Pregnant or trying to get pregnant Breast-feeding How should I use this medication? Take this medication by mouth with a glass of water. Follow the directions on the prescription label. You can take it with or without food. Do not crush or chew this medication. Take your medication at regular intervals. Do not take your medication more often than directed. Do not stop taking this medication suddenly except upon the advice of your care team. Stopping this medication too quickly may cause serious side effects or your condition may worsen. A special MedGuide will be given to you by the pharmacist with each prescription and refill. Be sure to read this information carefully each time. Talk to your care team regarding the use of this medication in children. Special care may be  needed. Overdosage: If you think you have taken too much of this medicine contact a poison control center or emergency room at once. NOTE: This medicine is only for you. Do not share this medicine with others. What if I miss a dose? If you miss a dose, take it as soon as you can. If it is almost time for your next dose, take only that dose. Do not take double or extra doses. What may interact with this medication? Do not take this medication with any of the following: Linezolid MAOIs like Carbex, Eldepryl, Marplan, Nardil, and Parnate Methylene blue (injected into a vein) Pimozide Thioridazine This medication may also interact with the following: Alcohol Amphetamines Aspirin and aspirin-like medications Atomoxetine Certain medications for depression, anxiety, or psychotic disturbances Certain medications for irregular heart beat like propafenone, flecainide, encainide, and quinidine Certain medications for migraine headache like almotriptan, eletriptan, frovatriptan, naratriptan, rizatriptan, sumatriptan, zolmitriptan Cimetidine Digoxin Diuretics Fentanyl Fosamprenavir Furazolidone Isoniazid Lithium Medications that treat or prevent blood clots like warfarin, enoxaparin, and dalteparin Medications for sleep NSAIDs, medications for pain and inflammation, like ibuprofen or naproxen Phenobarbital Phenytoin Procarbazine Rasagiline Ritonavir Supplements like St. John's wort, kava kava, valerian Tamoxifen Tramadol Tryptophan This list may not describe all possible interactions. Give your health care provider a list of all the medicines, herbs, non-prescription drugs, or dietary supplements you use. Also tell them if you smoke, drink alcohol, or use illegal drugs. Some items may interact with your medicine. What should I watch for while using this medication? Tell your care team if your symptoms do not get better or if they get worse. Visit your care team for regular checks on your  progress. Because it may take several weeks to see the  full effects of this medication, it is important to continue your treatment as prescribed by your care team. Watch for new or worsening thoughts of suicide or depression. This includes sudden changes in mood, behaviors, or thoughts. These changes can happen at any time but are more common in the beginning of treatment or after a change in dose. Call your care team right away if you experience these thoughts or worsening depression. Manic episodes may happen in patients with bipolar disorder who take this medication. Watch for changes in feelings or behaviors such as feeling anxious, nervous, agitated, panicky, irritable, hostile, aggressive, impulsive, severely restless, overly excited and hyperactive, or trouble sleeping. These changes can happen at any time but are more common in the beginning of treatment or after a change in dose. Call your care team right away if you notice any of these symptoms. You may get drowsy or dizzy. Do not drive, use machinery, or do anything that needs mental alertness until you know how this medication affects you. Do not stand or sit up quickly, especially if you are an older patient. This reduces the risk of dizzy or fainting spells. Alcohol may interfere with the effect of this medication. Avoid alcoholic drinks. Your mouth may get dry. Chewing sugarless gum or sucking hard candy, and drinking plenty of water will help. Contact your care team if the problem does not go away or is severe. What side effects may I notice from receiving this medication? Side effects that you should report to your care team as soon as possible: Allergic reactions--skin rash, itching, hives, swelling of the face, lips, tongue, or throat Bleeding--bloody or black, tar-like stools, red or dark brown urine, vomiting blood or brown material that looks like coffee grounds, small, red or purple spots on skin, unusual bleeding or bruising Heart  rhythm changes--fast or irregular heartbeat, dizziness, feeling faint or lightheaded, chest pain, trouble breathing Low sodium level--muscle weakness, fatigue, dizziness, headache, confusion Serotonin syndrome--irritability, confusion, fast or irregular heartbeat, muscle stiffness, twitching muscles, sweating, high fever, seizure, chills, vomiting, diarrhea Sudden eye pain or change in vision such as blurry vision, seeing halos around lights, vision loss Thoughts of suicide or self-harm, worsening mood, feelings of depression Side effects that usually do not require medical attention (report to your care team if they continue or are bothersome): Change in sex drive or performance Diarrhea Excessive sweating Nausea Tremors or shaking Upset stomach This list may not describe all possible side effects. Call your doctor for medical advice about side effects. You may report side effects to FDA at 1-800-FDA-1088. Where should I keep my medication? Keep out of the reach of children and pets. Store at or below 25 degrees C (77 degrees F). Throw away any unused medication after the expiration date. NOTE: This sheet is a summary. It may not cover all possible information. If you have questions about this medicine, talk to your doctor, pharmacist, or health care provider.  2022 Elsevier/Gold Standard (2020-04-15 00:00:00)

## 2021-04-01 NOTE — Progress Notes (Signed)
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  Patient today reports since the past few weeks she has been feeling more 'flat'.  Likely from the higher dosage of Seroquel, she is not sure.  Patient reports although mood wise she has been feeling better with the Seroquel she continues to have anxiety symptoms.  She feels shaky several times a day when she is anxious.  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  She takes hydroxyzine which helps with her tremors.  Patient reports she has been sober with regards to alcohol use since the past several weeks.  She continues to use cannabis and is currently cutting back.  Uses it, 1 joint-3 times a week or so.  She is interested in cutting back further.  Patient reports she continues to have nightmares, although she has been sleeping better she feels tense when she wakes up likely due to the way she sleeps at night.  Unknown if this is a side effect of the higher dosage of Seroquel.  Patient to monitor.  She has upcoming appointment with therapist in December.  She looks forward to that.  Patient denies any suicidality, homicidality or perceptual disturbances.  Patient is planning to spend her Thanksgiving holiday with her mother.  Her mother lives in [place].  Patient denies any other concerns today.  Visit Diagnosis:    ICD-10-CM   1. PTSD (post-traumatic stress disorder)  F43.10  PARoxetine (PAXIL CR) 12.5 MG 24 hr tablet    2. Bipolar and related disorder ([place])  F31.9    unspecified, unknown if substance induced    3. Panic disorder  F41.0 citalopram (CELEXA) 20 MG tablet    PARoxetine (PAXIL CR) 12.5 MG 24 hr tablet    4. Alcohol use disorder, moderate, in early remission ([place])  F10.21     5. Cannabis use disorder, mild, abuse  F12.10     6. Tobacco use disorder  F17.200     7. 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  Past trials of medications like Wellbutrin, Xanax, Valium, Prozac  Past Medical History:  Past Medical History:  Diagnosis Date   Anxiety and depression    Cancer ([place])    CERBVICAL   CIN III (cervical intraepithelial neoplasia grade III) with severe dysplasia    Complication of anesthesia    WAKES UP IN PANIC / CRYING   Depression    HPV (human papilloma virus) infection    Panic disorder    Panic disorder    PTSD (post-traumatic stress disorder)     Past Surgical History:  Procedure Laterality Date   BARTHOLIN CYST MARSUPIALIZATION N/A 09/09/2017   Procedure: BARTHOLIN CYST MARSUPIALIZATION;  Surgeon: Will Bonnet, MD;  Location: [facility] ORS;  Service: Gynecology;  Laterality: N/A;   CERVICAL BIOPSY  W/ LOOP ELECTRODE EXCISION     DENTAL SURGERY      Family Psychiatric History: Reviewed family psychiatric history from progress note on 03/04/2021  Family History:  Family History  Problem Relation Age of Onset   Drug abuse Mother    Alcohol abuse Mother    Colon cancer Mother 15   Hypertension Mother    Stroke Mother    Alcohol abuse Father    Mental illness Father    Bipolar disorder Maternal Aunt    Mental illness Paternal Aunt    Mental illness Paternal Uncle    Suicidality Paternal Uncle    Seizures Maternal Grandmother    Breast cancer Other 80    Ovarian cancer Other 56   Suicidality Maternal Great-grandmother     Social History: Reviewed social history from progress note on 03/04/2021 Social History   Socioeconomic History   Marital status: Married    Spouse name: anthony   Number of children: 2   Years of education: Not on file   Highest education level: Associate degree: occupational, Hotel manager, or vocational program  Occupational History   Not on file  Tobacco Use   Smoking status: Every Day    Packs/day: 0.50    Years: 15.00    Pack years: 7.50    Types: Cigarettes    Last attempt to quit: 08/08/2017    Years since quitting: 3.6   Smokeless tobacco: Never  Vaping Use   Vaping Use: Every day  Substance and Sexual Activity   Alcohol use: Not Currently    Comment: RARE   Drug use: Yes    Types: Marijuana   Sexual activity: Yes    Birth control/protection: Surgical    Comment: vasectomy  Other Topics Concern   Not on file  Social History Narrative   Not on file   Social Determinants of Health   Financial Resource Strain: Not on file  Food Insecurity: Not on file  Transportation Needs: Not on file  Physical Activity: Not on file  Stress: Not on file  Social Connections: Not on file    Allergies:  Allergies  Allergen Reactions   Shellfish Allergy Anaphylaxis, Swelling and Other (See Comments)    Eyes swelling, thickening tongue   Zithromax [Azithromycin] Anaphylaxis   Molds & Smuts    Naproxen    Other     See List scanned in to Media.   Penicillins     Metabolic Disorder Labs: Lab Results  Component Value Date   HGBA1C 5.1 03/04/2021   MPG 100 03/04/2021   Lab Results  Component Value Date   PROLACTIN 5.1 03/04/2021   Lab Results  Component Value Date   CHOL 127 03/04/2021   TRIG 33 03/04/2021   HDL 46 03/04/2021   CHOLHDL 2.8 03/04/2021   VLDL 7 03/04/2021   LDLCALC 74 03/04/2021   Lab Results  Component Value Date   TSH 0.508 03/04/2021    Therapeutic Level Labs: No  results found for: LITHIUM No results found for: VALPROATE No components found for:  CBMZ  Current Medications: Current Outpatient Medications  Medication Sig Dispense Refill   acetaminophen (TYLENOL) 500 MG tablet Take 1,000 mg by mouth every 6 (six) hours as needed for moderate pain or headache.     ADVAIR DISKUS 250-50 MCG/DOSE AEPB Inhale 1 puff into the lungs 2 (two) times daily.     albuterol (VENTOLIN HFA) 108 (90 Base) MCG/ACT inhaler ProAir HFA 90 mcg/actuation aerosol inhaler  INHALE 1 TO 2  PUFFS BY MOUTH EVERY 4 TO 6 HOURS AS NEEDED FOR COUGH OR WHEEZING     EPINEPHrine 0.3 mg/0.3 mL IJ SOAJ injection Inject 0.3 mg into the muscle as needed.     famotidine (PEPCID) 40 MG tablet Take 1 tablet by mouth daily.     hydrOXYzine (VISTARIL) 25 MG capsule Take 1 capsule (25 mg total) by mouth 2 (two) times daily as needed for anxiety. Severe anxiety attacks 60 capsule 1   levocetirizine (XYZAL) 5 MG tablet Take 1 tablet by mouth daily.     LORazepam (ATIVAN) 0.5 MG tablet Take 0.5 mg by mouth daily as needed.     montelukast (SINGULAIR) 10 MG tablet Take 10 mg by mouth daily.     PARoxetine (PAXIL CR) 12.5 MG 24 hr tablet Take 1 tablet (12.5 mg total) by mouth daily. 30 tablet 0   QUEtiapine (SEROQUEL) 50 MG tablet Take 1 tablet (50 mg total) by mouth at bedtime. 30 tablet 0   citalopram (CELEXA) 20 MG tablet Take 0.5 tablets (10 mg total) by mouth daily. Take half tablet daily for 5 days and stop 3 tablet 0   No current facility-administered medications for this visit.     Musculoskeletal: Strength & Muscle Tone: within normal limits Gait & Station: normal Patient leans: N/A  Psychiatric Specialty Exam: Review of Systems  Psychiatric/Behavioral:  Positive for sleep disturbance. The patient is nervous/anxious.   All other systems reviewed and are negative.  Blood pressure 110/77, pulse 98, temperature 98.7 F (37.1 C), temperature source Temporal, weight 152 lb (68.9 kg), not  currently breastfeeding.Body mass index is 24.53 kg/m.  General Appearance: Casual  Eye Contact:  Fair  Speech:  Clear and Coherent  Volume:  Normal  Mood:  Anxious  Affect:  Congruent  Thought Process:  Goal Directed and Descriptions of Associations: Intact  Orientation:  Full (Time, Place, and Person)  Thought Content: Logical   Suicidal Thoughts:  No  Homicidal Thoughts:  No  Memory:  Immediate;   Fair Recent;   Fair Remote;   Fair  Judgement:  Fair  Insight:  Fair  Psychomotor Activity:  Normal  Concentration:  Concentration: Fair and Attention Span: Fair  Recall:  [employer] of Knowledge: Fair  Language: Fair  Akathisia:  No  Handed:  Right  AIMS (if indicated): done, 0  Assets:  Communication Skills Desire for Improvement Housing Social Support  ADL's:  Intact  Cognition: WNL  Sleep:   restless but improving - has nightmares   Screenings: AUDIT    Flowsheet Row Office Visit from 03/04/2021 in [facility]  Alcohol Use Disorder Identification Test Final Score (AUDIT) 22      GAD-7    Flowsheet Row Office Visit from 03/04/2021 in [facility]  Total GAD-7 Score 21      PHQ2-9    [place] Visit from 04/01/2021 in [facility] Office Visit from 03/11/2021 in [facility] Visit from 03/04/2021 in [facility]  PHQ-2 Total Score 0 4 1  PHQ-9 Total Score 10 17 19       [place] Visit from 04/01/2021 in [facility] Office Visit from 03/11/2021 in [facility] Office Visit from 03/04/2021 in [facility] No Risk No Risk No Risk        Assessment and Plan: Anna Woodard is a 30 year old Caucasian female who has a  history of PTSD, bipolar and related disorder unspecified, cannabis, alcohol  use disorder, history of hallucinogen abuse, MDMA abuse, tobacco use disorder was evaluated in office today.  Patient continues to struggle with anxiety, possible side effects to Seroquel, discussed the following plan.  Plan Bipolar and related disorder, unspecified-some improvement Unknown if substance-induced due to extensive history. Continue Seroquel 50 mg p.o. nightly. Patient feels more' flat ' per reports since being on the higher dosage.  However patient is agreeable to giving the medication more time.  We will consider reducing the dosage as needed.  PTSD-unstable Reduce Celexa to 10 mg p.o. daily for the next 5 days and stop taking it. Start Paxil extended release 12.5 mg p.o. daily Continue Seroquel as prescribed Patient has been referred to the therapist and has upcoming appointment.  Panic disorder-improving Continue hydroxyzine 25 mg p.o. twice daily as needed for severe anxiety attacks Start Paxil extended release 12.5 mg p.o. daily  Alcohol use disorder in early remission Provided counseling.  Cannabis use disorder-improving Provided counseling.  Patient is cutting back  Tobacco use disorder-improving Provided counseling for 1 minute.  Follow-up in clinic in 2 to 3 weeks or sooner in person.  This note was generated in part or whole with voice recognition software. Voice recognition is usually quite accurate but there are transcription errors that can and very often do occur. I apologize for any typographical errors that were not detected and corrected.      Ursula Alert, MD 04/01/2021, 4:54 PM

## 2021-04-06 ENCOUNTER — Telehealth: Payer: Self-pay

## 2021-04-06 NOTE — Telephone Encounter (Signed)
It is difficult to tell from the note which medication was discussed. For now, advise her to stay on citalopram at the original dose (which I believe was 30 mg daily.) Will forward this note to Dr. Shea Evans so that she can address when she returns.

## 2021-04-06 NOTE — Telephone Encounter (Signed)
pt left a message that the paxil was too much money and that you had said that it was something else she could try that was less if she needed it to be.

## 2021-04-07 ENCOUNTER — Telehealth: Payer: Self-pay | Admitting: Psychiatry

## 2021-04-07 NOTE — Telephone Encounter (Signed)
Attempted to contact patient again, left a voicemail.

## 2021-04-07 NOTE — Telephone Encounter (Signed)
Returned call to patient, left voicemail to call me back.

## 2021-04-10 ENCOUNTER — Telehealth: Payer: Self-pay | Admitting: Psychiatry

## 2021-04-10 NOTE — Telephone Encounter (Signed)
Attempted to contact patient again to discuss starting Paxil immediate release.  Had to leave a voicemail.  Advised patient to call us back.  Or let us know if she is interested in the Paxil immediate release which could be sent to the pharmacy.

## 2021-04-16 ENCOUNTER — Ambulatory Visit (INDEPENDENT_AMBULATORY_CARE_PROVIDER_SITE_OTHER): Payer: Self-pay | Admitting: Licensed Clinical Social Worker

## 2021-04-16 DIAGNOSIS — Z91199 Patient's noncompliance with other medical treatment and regimen due to unspecified reason: Secondary | ICD-10-CM

## 2021-04-16 NOTE — Progress Notes (Signed)
LCSW counselor tried to connect with patient for scheduled appointment in office at 1:00pm; also tried to connect via phone without success. LCSW counselor could not leave message due to phone number being disconnected.

## 2021-04-28 ENCOUNTER — Other Ambulatory Visit: Payer: Self-pay

## 2021-04-28 ENCOUNTER — Ambulatory Visit: Payer: 59 | Admitting: Psychiatry

## 2021-04-28 ENCOUNTER — Encounter: Payer: Self-pay | Admitting: Psychiatry

## 2021-04-28 VITALS — BP 126/81 | HR 71 | Temp 97.2°F | Wt 154.4 lb

## 2021-04-28 DIAGNOSIS — F319 Bipolar disorder, unspecified: Secondary | ICD-10-CM | POA: Diagnosis not present

## 2021-04-28 DIAGNOSIS — F17200 Nicotine dependence, unspecified, uncomplicated: Secondary | ICD-10-CM

## 2021-04-28 DIAGNOSIS — F1691 Hallucinogen use, unspecified, in remission: Secondary | ICD-10-CM

## 2021-04-28 DIAGNOSIS — F1210 Cannabis abuse, uncomplicated: Secondary | ICD-10-CM

## 2021-04-28 DIAGNOSIS — F4310 Post-traumatic stress disorder, unspecified: Secondary | ICD-10-CM | POA: Diagnosis not present

## 2021-04-28 DIAGNOSIS — F410 Panic disorder [episodic paroxysmal anxiety] without agoraphobia: Secondary | ICD-10-CM | POA: Diagnosis not present

## 2021-04-28 DIAGNOSIS — F1021 Alcohol dependence, in remission: Secondary | ICD-10-CM | POA: Diagnosis not present

## 2021-04-28 DIAGNOSIS — F1611 Hallucinogen abuse, in remission: Secondary | ICD-10-CM

## 2021-04-28 MED ORDER — QUETIAPINE FUMARATE 50 MG PO TABS: 50.0000 mg | ORAL_TABLET | Freq: Every day | ORAL | 0 refills | Status: DC

## 2021-04-28 MED ORDER — PAROXETINE HCL 20 MG PO TABS
20.0000 mg | ORAL_TABLET | Freq: Every day | ORAL | 0 refills | Status: DC
Start: 2021-04-28 — End: 2021-05-26

## 2021-04-28 NOTE — Progress Notes (Signed)
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  Patient today reports she ran out of her Seroquel a couple of days ago and reports she did not know to call the clinic to get more refills.  She also currently takes Celexa 20 mg daily since she could not afford the Paxil extended release.  Patient reports she continues to feel overwhelmed and anxious a lot.  She reports she cannot take the hydroxyzine since she is on another antihistamine for seasonal allergies.  She is interested in medication management of her anxiety symptoms.  She is currently compliant with psychotherapy sessions.  She reports therapy sessions is beneficial.  Sleep is improved.  She likes that effect of Seroquel.  Patient does struggle with appetite changes.  She reports most of the time she does not like to think about food since that thought makes her nauseous.  She however reports once she eats most of the time she is okay.  She has had episodes of throwing up in the past after eating, not self-induced.  She does take Pepcid for her GI problems.  Has not followed up with the provider recently for her GI issues.  Denies any binging on food.  Denies any bulimic episodes.  Denies using any laxatives or inducing vomiting herself.  Patient does report she is preoccupied with her weight.  Her weight compared to her previous visits looks currently improved.  Patient denies any suicidality, homicidality or perceptual disturbances.  She continues  to use cannabis at least a couple of times a week, reports she uses it for her anxiety.  Currently stays away from alcohol.  Quit smoking cigarettes a week ago.  Patient denies any other concerns today.  Visit Diagnosis:    ICD-10-CM   1. PTSD (post-traumatic stress disorder)  F43.10 PARoxetine (PAXIL) 20 MG tablet    QUEtiapine (SEROQUEL) 50 MG tablet    2. Bipolar and related disorder ([facility])  F31.9 QUEtiapine (SEROQUEL) 50 MG tablet   likely substance induced- remission    3. Panic disorder  F41.0 PARoxetine (PAXIL) 20 MG tablet    4. Alcohol use disorder, moderate, in early remission ([place])  F10.21     5. Cannabis use disorder, mild, abuse  F12.10     6. Tobacco use disorder  F17.200     7. 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  Past trials of medications like Wellbutrin, Xanax, Valium, Prozac  Past Medical History:  Past Medical History:  Diagnosis Date   Anxiety and depression    Cancer ([place])    CERBVICAL   CIN III (cervical intraepithelial neoplasia grade III) with severe dysplasia    Complication of anesthesia    WAKES UP IN PANIC / CRYING   Depression    HPV (human papilloma virus) infection    Panic disorder    Panic disorder    PTSD (post-traumatic  stress disorder)     Past Surgical History:  Procedure Laterality Date   BARTHOLIN CYST MARSUPIALIZATION N/A 09/09/2017   Procedure: BARTHOLIN CYST MARSUPIALIZATION;  Surgeon: Will Bonnet, MD;  Location: [facility] ORS;  Service: Gynecology;  Laterality: N/A;   CERVICAL BIOPSY  W/ LOOP ELECTRODE EXCISION     DENTAL SURGERY      Family Psychiatric History: Reviewed family psychiatric history from progress note on 03/04/2021  Family History:  Family History  Problem Relation Age of Onset   Drug abuse Mother    Alcohol abuse Mother    Colon  cancer Mother 31   Hypertension Mother    Stroke Mother    Alcohol abuse Father    Mental illness Father    Bipolar disorder Maternal Aunt    Mental illness Paternal Aunt    Mental illness Paternal Uncle    Suicidality Paternal Uncle    Seizures Maternal Grandmother    Breast cancer Other 54   Ovarian cancer Other 35   Suicidality Maternal Great-grandmother     Social History: Reviewed social history from progress note on 03/04/2021 Social History   Socioeconomic History   Marital status: Married    Spouse name: anthony   Number of children: 2   Years of education: Not on file   Highest education level: Associate degree: occupational, Hotel manager, or vocational program  Occupational History   Not on file  Tobacco Use   Smoking status: Former    Packs/day: 0.50    Years: 15.00    Pack years: 7.50    Types: Cigarettes    Quit date: 04/23/2021    Years since quitting: 0.0   Smokeless tobacco: Never  Vaping Use   Vaping Use: Former  Substance and Sexual Activity   Alcohol use: Not Currently    Comment: RARE   Drug use: Yes    Types: Marijuana   Sexual activity: Yes    Birth control/protection: Surgical    Comment: vasectomy  Other Topics Concern   Not on file  Social History Narrative   Not on file   Social Determinants of Health   Financial Resource Strain: Not on file  Food Insecurity: Not on file  Transportation Needs: Not on file  Physical Activity: Not on file  Stress: Not on file  Social Connections: Not on file    Allergies:  Allergies  Allergen Reactions   Shellfish Allergy Anaphylaxis, Swelling and Other (See Comments)    Eyes swelling, thickening tongue   Zithromax [Azithromycin] Anaphylaxis   Molds & Smuts    Naproxen    Other     See List scanned in to Media.   Penicillins     Metabolic Disorder Labs: Lab Results  Component Value Date   HGBA1C 5.1 03/04/2021   MPG 100 03/04/2021   Lab Results  Component Value Date   PROLACTIN 5.1  03/04/2021   Lab Results  Component Value Date   CHOL 127 03/04/2021   TRIG 33 03/04/2021   HDL 46 03/04/2021   CHOLHDL 2.8 03/04/2021   VLDL 7 03/04/2021   LDLCALC 74 03/04/2021   Lab Results  Component Value Date   TSH 0.508 03/04/2021    Therapeutic Level Labs: No results found for: LITHIUM No results found for: VALPROATE No components found for:  CBMZ  Current Medications: Current Outpatient Medications  Medication Sig Dispense Refill   acetaminophen (TYLENOL) 500 MG tablet Take 1,000 mg by mouth every 6 (six) hours as needed for moderate pain or  headache.     ADVAIR DISKUS 250-50 MCG/DOSE AEPB Inhale 1 puff into the lungs 2 (two) times daily.     albuterol (VENTOLIN HFA) 108 (90 Base) MCG/ACT inhaler ProAir HFA 90 mcg/actuation aerosol inhaler  INHALE 1 TO 2 PUFFS BY MOUTH EVERY 4 TO 6 HOURS AS NEEDED FOR COUGH OR WHEEZING     EPINEPHrine 0.3 mg/0.3 mL IJ SOAJ injection Inject 0.3 mg into the muscle as needed.     famotidine (PEPCID) 40 MG tablet Take 1 tablet by mouth daily.     hydrOXYzine (VISTARIL) 25 MG capsule Take 1 capsule (25 mg total) by mouth 2 (two) times daily as needed for anxiety. Severe anxiety attacks 60 capsule 1   levocetirizine (XYZAL) 5 MG tablet Take 1 tablet by mouth daily.     LORazepam (ATIVAN) 0.5 MG tablet Take 0.5 mg by mouth daily as needed.     montelukast (SINGULAIR) 10 MG tablet Take 10 mg by mouth daily.     PARoxetine (PAXIL) 20 MG tablet Take 1 tablet (20 mg total) by mouth daily with breakfast. 30 tablet 0   QUEtiapine (SEROQUEL) 50 MG tablet Take 1 tablet (50 mg total) by mouth at bedtime. 30 tablet 0   No current facility-administered medications for this visit.     Musculoskeletal: Strength & Muscle Tone: within normal limits Gait & Station: normal Patient leans: N/A  Psychiatric Specialty Exam: Review of Systems  Constitutional:  Positive for appetite change.  Gastrointestinal:  Positive for nausea.   Psychiatric/Behavioral:  The patient is nervous/anxious.   All other systems reviewed and are negative.  Blood pressure 126/81, pulse 71, temperature (!) 97.2 F (36.2 C), temperature source Temporal, weight 154 lb 6.4 oz (70 kg), not currently breastfeeding.Body mass index is 24.92 kg/m.  General Appearance: Fairly Groomed  Eye Contact:  Fair  Speech:  Clear and Coherent  Volume:  Normal  Mood:  Anxious  Affect:  Congruent  Thought Process:  Goal Directed and Descriptions of Associations: Intact  Orientation:  Full (Time, Place, and Person)  Thought Content: Logical   Suicidal Thoughts:  No  Homicidal Thoughts:  No  Memory:  Immediate;   Fair Recent;   Fair Remote;   Fair  Judgement:  Fair  Insight:  Fair  Psychomotor Activity:  Normal  Concentration:  Concentration: Fair and Attention Span: Fair  Recall:  [employer] of Knowledge: Fair  Language: Fair  Akathisia:  No  Handed:  Right  AIMS (if indicated): done, 0  Assets:  Communication Skills Desire for Improvement Housing Social Support  ADL's:  Intact  Cognition: WNL  Sleep:  Fair   Screenings: AUDIT    Flowsheet Row Office Visit from 03/04/2021 in [facility]  Alcohol Use Disorder Identification Test Final Score (AUDIT) 22      [place] Office Visit from 04/28/2021 in [facility] Office Visit from 03/04/2021 in [facility]  Total GAD-7 Score 14 21      PHQ2-9    [place] Visit from 04/28/2021 in [facility] Visit from 04/01/2021 in [facility] Visit from 03/11/2021 in [facility] Visit from 03/04/2021 in [facility]  PHQ-2 Total Score 1 0 4 1  PHQ-9 Total Score 7 10 17 19       [place] Office Visit from 04/28/2021 in [facility] Visit from 04/01/2021 in  [facility] Office Visit from 03/11/2021 in [facility] No Risk No Risk No Risk        Assessment and Plan: Kaleiah Kutzer is a 30 year old Caucasian female who has a history of PTSD, bipolar and related disorder, cannabis, alcohol use disorder, history of hallucinogen abuse, MDMA abuse, tobacco use disorder was evaluated in office today.  Patient with appetite changes unspecified, anxiety symptoms, will benefit from medication management as well as psychotherapy sessions.  Plan as noted below.  Plan Bipolar and related disorder unspecified in remission Likely substance induced. Continue Seroquel 50 mg p.o. nightly.  PTSD-unstable Taper off Celexa. Start Paxil 20 mg p.o. daily. Provided medication education.  Panic disorder-improving Start Paxil 20 mg p.o. daily Continue CBT with Ms.Christina Hussami  Cannabis use disorder-unstable Provided counseling.  Patient advised to stop using cannabis.  Tobacco use disorder-in remission Patient quit smoking a week ago.  Patient with appetite change, nausea as noted above-advised to follow up with primary care provider for medical clearance.  Unknown if this is medication induced.  Patient also to discuss this with therapist-will need to rule out eating disorder.  Follow-up in clinic in 3 weeks or sooner in person.  This note was generated in part or whole with voice recognition software. Voice recognition is usually quite accurate but there are transcription errors that can and very often do occur. I apologize for any typographical errors that were not detected and corrected.      Ursula Alert, MD 04/29/2021, 11:46 AM

## 2021-05-18 ENCOUNTER — Telehealth: Payer: Self-pay | Admitting: Licensed Clinical Social Worker

## 2021-05-18 NOTE — Telephone Encounter (Signed)
Disregard.  error

## 2021-05-19 ENCOUNTER — Ambulatory Visit: Payer: Medicaid Other | Admitting: Advanced Practice Midwife

## 2021-05-21 ENCOUNTER — Ambulatory Visit: Payer: Medicaid Other | Admitting: Advanced Practice Midwife

## 2021-05-22 ENCOUNTER — Other Ambulatory Visit: Payer: Self-pay

## 2021-05-22 ENCOUNTER — Telehealth (INDEPENDENT_AMBULATORY_CARE_PROVIDER_SITE_OTHER): Payer: 59 | Admitting: Psychiatry

## 2021-05-22 DIAGNOSIS — F4310 Post-traumatic stress disorder, unspecified: Secondary | ICD-10-CM

## 2021-05-22 NOTE — Progress Notes (Signed)
Patient currently sick with flulike symptoms although improving.  Her daughter has severe symptoms. Hence we will reschedule this appointment for another day.

## 2021-05-25 ENCOUNTER — Other Ambulatory Visit: Payer: Self-pay | Admitting: Psychiatry

## 2021-05-25 DIAGNOSIS — F319 Bipolar disorder, unspecified: Secondary | ICD-10-CM

## 2021-05-25 DIAGNOSIS — F410 Panic disorder [episodic paroxysmal anxiety] without agoraphobia: Secondary | ICD-10-CM

## 2021-05-25 DIAGNOSIS — F4310 Post-traumatic stress disorder, unspecified: Secondary | ICD-10-CM

## 2021-06-10 ENCOUNTER — Other Ambulatory Visit: Payer: Self-pay

## 2021-06-10 ENCOUNTER — Ambulatory Visit (INDEPENDENT_AMBULATORY_CARE_PROVIDER_SITE_OTHER): Payer: 59 | Admitting: Licensed Clinical Social Worker

## 2021-06-10 DIAGNOSIS — F319 Bipolar disorder, unspecified: Secondary | ICD-10-CM | POA: Diagnosis not present

## 2021-06-10 DIAGNOSIS — F4310 Post-traumatic stress disorder, unspecified: Secondary | ICD-10-CM
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
## 2021-06-11 NOTE — Plan of Care (Signed)
Developed with pt input

## 2021-06-12 ENCOUNTER — Encounter: Payer: Self-pay | Admitting: Psychiatry

## 2021-06-12 ENCOUNTER — Other Ambulatory Visit: Payer: Self-pay

## 2021-06-12 ENCOUNTER — Telehealth (INDEPENDENT_AMBULATORY_CARE_PROVIDER_SITE_OTHER): Payer: 59 | Admitting: Psychiatry

## 2021-06-12 DIAGNOSIS — F4310 Post-traumatic stress disorder, unspecified: Secondary | ICD-10-CM

## 2021-06-12 DIAGNOSIS — F1021 Alcohol dependence, in remission: Secondary | ICD-10-CM

## 2021-06-12 DIAGNOSIS — F1691 Hallucinogen use, unspecified, in remission: Secondary | ICD-10-CM

## 2021-06-12 DIAGNOSIS — F410 Panic disorder [episodic paroxysmal anxiety] without agoraphobia: Secondary | ICD-10-CM

## 2021-06-12 DIAGNOSIS — F1210 Cannabis abuse, uncomplicated: Secondary | ICD-10-CM

## 2021-06-12 DIAGNOSIS — F1611 Hallucinogen abuse, in remission: Secondary | ICD-10-CM

## 2021-06-12 DIAGNOSIS — F319 Bipolar disorder, unspecified: Secondary | ICD-10-CM | POA: Diagnosis not present

## 2021-06-12 MED ORDER — PAROXETINE HCL 20 MG PO TABS: ORAL_TABLET | ORAL | 0 refills | Status: DC

## 2021-06-12 MED ORDER — PRAZOSIN HCL 1 MG PO CAPS: 1.0000 mg | ORAL_CAPSULE | Freq: Every day | ORAL | 0 refills | Status: DC

## 2021-06-12 NOTE — Progress Notes (Signed)
Virtual Visit via Video Note  I connected with Anna Woodard on 06/12/21 at 11:20 AM EST by a video enabled telemedicine application and verified that I am speaking with the correct person using two identifiers.  Location Provider Location : [facility] Patient Location : Car  Participants: Patient , Provider    I discussed the limitations of evaluation and management by telemedicine and the availability of in person appointments. The patient expressed understanding and agreed to proceed.  I discussed the assessment and treatment plan with the patient. The patient was provided an opportunity to ask questions and all were answered. The patient agreed with the plan and demonstrated an understanding of the instructions.   The patient was advised to call back or seek an in-person evaluation if the symptoms worsen or if the condition fails to improve as anticipated.   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  Patient reports she is currently improving with regards to her flulike symptoms.  She reports she does have a lingering cough.  Her daughter is also doing  well.  Patient reports mood wise she is at a stable place.  Denies any significant anxiety or mood swings.  The Paxil does help.  She however does report mild depressive symptoms like feeling down, tired, being restless off and on since the past few weeks.  Likely this is also contributed to by her recent upper respiratory tract infection symptoms.  Patient reports appetite as low during the day however she tends to eat more at the end of the day after taking her Seroquel.  Overall she reports she has been eating enough for herself.  Patient reports sleep as restless due to nightmares.  Patient reports recently she felt like she had sleep paralysis which was isolated does not happen often.  Patient however reports nightmares happening on a regular basis.  Does have a history of trauma.  Currently following up with therapist which has been beneficial.  Agreeable to keep a dream journal.  Agreeable with treatment planning.  Agreeable to start medication like prazosin for nightmares.Denies suicidality, homicidality or perceptual disturbances.  Patient denies any other concerns today.  Visit Diagnosis:    ICD-10-CM   1. PTSD (post-traumatic stress disorder)  F43.10 prazosin (MINIPRESS) 1 MG capsule    PARoxetine (PAXIL) 20 MG tablet    2. Bipolar and related disorder ([place])  F31.9    likely secondary to substance use    3. Panic disorder  F41.0 PARoxetine (PAXIL) 20 MG tablet    4. Alcohol use disorder, moderate, in early remission ([place])  F10.21     5. Cannabis use disorder, mild, abuse  F12.10     6. 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  Past trials of medications like Wellbutrin, Xanax, Valium, Prozac.  Past Medical History:  Past Medical History:  Diagnosis Date   Anxiety and depression    Cancer ([place])    CERBVICAL    CIN III (cervical intraepithelial neoplasia grade III) with severe dysplasia    Complication of anesthesia    WAKES UP IN PANIC / CRYING   Depression    HPV (human papilloma virus) infection    Panic disorder    Panic disorder    PTSD (post-traumatic stress disorder)     Past Surgical History:  Procedure Laterality Date   BARTHOLIN CYST MARSUPIALIZATION N/A 09/09/2017   Procedure: BARTHOLIN CYST MARSUPIALIZATION;  Surgeon: Will Bonnet, MD;  Location: [facility] ORS;  Service: Gynecology;  Laterality: N/A;   CERVICAL BIOPSY  W/ LOOP ELECTRODE EXCISION     DENTAL SURGERY      Family Psychiatric History: Reviewed past psychiatric history from progress note on 03/04/2021.  Family History:  Family History  Problem Relation Age of Onset   Drug abuse Mother    Alcohol abuse Mother    Colon cancer Mother 70   Hypertension Mother    Stroke Mother    Alcohol abuse Father    Mental illness Father    Bipolar disorder Maternal Aunt    Mental illness Paternal Aunt    Mental illness Paternal Uncle    Suicidality Paternal Uncle    Seizures Maternal Grandmother    Breast cancer Other 56   Ovarian cancer Other 19   Suicidality Maternal Great-grandmother     Social History: Reviewed social history from progress note on 03/04/2021. Social History   Socioeconomic History   Marital status: Married    Spouse name: anthony   Number of children: 2   Years of education: Not on file   Highest education level: Associate degree: occupational, Hotel manager, or vocational program  Occupational History   Not on file  Tobacco Use   Smoking status: Former    Packs/day: 0.50    Years: 15.00    Pack years: 7.50    Types: Cigarettes    Quit date: 04/23/2021    Years since quitting: 0.1   Smokeless tobacco: Never  Vaping Use   Vaping Use: Former  Substance and Sexual Activity   Alcohol use: Not Currently    Comment: RARE   Drug use: Yes    Types: Marijuana   Sexual activity: Yes     Birth control/protection: Surgical    Comment: vasectomy  Other Topics Concern   Not on file  Social History Narrative   Not on file   Social Determinants of Health   Financial Resource Strain: Not on file  Food Insecurity: Not on file  Transportation Needs: Not on file  Physical Activity: Not on file  Stress: Not on file  Social Connections: Not on file    Allergies:  Allergies  Allergen Reactions   Shellfish Allergy Anaphylaxis, Swelling and Other (See Comments)    Eyes swelling, thickening tongue   Zithromax [Azithromycin] Anaphylaxis   Molds & Smuts    Naproxen    Other     See List scanned in to Media.   Penicillins     Metabolic Disorder Labs: Lab Results  Component Value Date   HGBA1C 5.1 03/04/2021   MPG 100 03/04/2021   Lab Results  Component Value Date   PROLACTIN 5.1 03/04/2021   Lab Results  Component  Value Date   CHOL 127 03/04/2021   TRIG 33 03/04/2021   HDL 46 03/04/2021   CHOLHDL 2.8 03/04/2021   VLDL 7 03/04/2021   LDLCALC 74 03/04/2021   Lab Results  Component Value Date   TSH 0.508 03/04/2021    Therapeutic Level Labs: No results found for: LITHIUM No results found for: VALPROATE No components found for:  CBMZ  Current Medications: Current Outpatient Medications  Medication Sig Dispense Refill   prazosin (MINIPRESS) 1 MG capsule Take 1 capsule (1 mg total) by mouth at bedtime. For nightmares 30 capsule 0   acetaminophen (TYLENOL) 500 MG tablet Take 1,000 mg by mouth every 6 (six) hours as needed for moderate pain or headache.     ADVAIR DISKUS 250-50 MCG/DOSE AEPB Inhale 1 puff into the lungs 2 (two) times daily.     albuterol (VENTOLIN HFA) 108 (90 Base) MCG/ACT inhaler ProAir HFA 90 mcg/actuation aerosol inhaler  INHALE 1 TO 2 PUFFS BY MOUTH EVERY 4 TO 6 HOURS AS NEEDED FOR COUGH OR WHEEZING     EPINEPHrine 0.3 mg/0.3 mL IJ SOAJ injection Inject 0.3 mg into the muscle as needed.     famotidine (PEPCID) 40 MG tablet Take 1 tablet  by mouth daily.     hydrOXYzine (VISTARIL) 25 MG capsule Take 1 capsule (25 mg total) by mouth 2 (two) times daily as needed for anxiety. Severe anxiety attacks 60 capsule 1   levocetirizine (XYZAL) 5 MG tablet Take 1 tablet by mouth daily.     LORazepam (ATIVAN) 0.5 MG tablet Take 0.5 mg by mouth daily as needed.     montelukast (SINGULAIR) 10 MG tablet Take 10 mg by mouth daily.     PARoxetine (PAXIL) 20 MG tablet TAKE 1 TABLET(20 MG) BY MOUTH DAILY WITH BREAKFAST 30 tablet 0   QUEtiapine (SEROQUEL) 50 MG tablet TAKE 1 TABLET(50 MG) BY MOUTH AT BEDTIME 30 tablet 0   No current facility-administered medications for this visit.     Musculoskeletal: Strength & Muscle Tone:  UTA Gait & Station: normal Patient leans: N/A  Psychiatric Specialty Exam: Review of Systems  Respiratory:  Positive for cough.   Psychiatric/Behavioral:  Positive for sleep disturbance. The patient is nervous/anxious.   All other systems reviewed and are negative.  not currently breastfeeding.There is no height or weight on file to calculate BMI.  General Appearance: Casual  Eye Contact:  Fair  Speech:  Clear and Coherent  Volume:  Normal  Mood:  Anxious  Affect:  Appropriate  Thought Process:  Goal Directed and Descriptions of Associations: Intact  Orientation:  Full (Time, Place, and Person)  Thought Content: Logical   Suicidal Thoughts:  No  Homicidal Thoughts:  No  Memory:  Immediate;   Fair Recent;   Fair Remote;   Fair  Judgement:  Fair  Insight:  Fair  Psychomotor Activity:  Normal  Concentration:  Concentration: Fair and Attention Span: Fair  Recall:  [employer] of Knowledge: Fair  Language: Fair  Akathisia:  No  Handed:  Right  AIMS (if indicated): not done  Assets:  Communication Skills Desire for Improvement Housing Intimacy Social Support  ADL's:  Intact  Cognition: WNL  Sleep:  Poor   Screenings: AUDIT    Flowsheet Row Office Visit from 03/04/2021 in [place]  Alcohol Use Disorder Identification Test Final Score (AUDIT) 22      GAD-7    Flowsheet Row Office Visit from 04/28/2021 in [place]  Office Visit from 03/04/2021 in [place]  Total GAD-7 Score 14 21      PHQ2-9    Flowsheet Row Video Visit from 06/12/2021 in [place] Counselor from 06/10/2021 in [place] Office Visit from 04/28/2021 in [place] Office Visit from 04/01/2021 in [place] Office Visit from 03/11/2021 in [place]  PHQ-2 Total Score 2 1 1  0 4  PHQ-9 Total Score 8 10 7 10 17       Flowsheet Row Counselor from 06/10/2021 in [place] Office Visit from 04/28/2021 in [place] Office Visit from 04/01/2021 in [place] No Risk No Risk No Risk        Assessment and Plan: Anna Woodard is a 32 year old Caucasian female who has a history of PTSD, bipolar and related disorder, cannabis, alcohol use disorder, history of hallucinogen abuse, MDMA abuse, tobacco use disorder was evaluated by telemedicine today.  Patient is currently struggling with nightmares which does have an impact on her sleep.  Discussed plan as noted below.  Plan Bipolar and related disorder likely substance induced currently in remission Seroquel 50 mg p.o. nightly  PTSD-unstable Continue Paxil 20 mg p.o. daily Start prazosin 1 mg p.o. nightly for nightmares Discussed dream planning, keeping a dream journal.  Discussed to continue CBT.  Panic disorder-improving Paxil 20 mg p.o. daily Continue CBT with Ms. Christina Hussami  Cannabis use disorder-improving We will monitor closely  Tobacco use disorder in remission We will monitor closely  Patient to  follow up with primary care provider as needed for her lingering cough.  Follow-up in clinic in 4 to 5 weeks or sooner in person.  This note was generated in part or whole with voice recognition software. Voice recognition is usually quite accurate but there are transcription errors that can and very often do occur. I apologize for any typographical errors that were not detected and corrected.      Ursula Alert, MD 06/12/2021, 12:04 PM

## 2021-06-12 NOTE — Patient Instructions (Signed)
Prazosin Oral Capsules What is this medication? PRAZOSIN (PRA zoe sin) is an alpha blocker. It treats high blood pressure. This medicine may be used for other purposes; ask your health care provider or pharmacist if you have questions. COMMON BRAND NAME(S): Minipress What should I tell my care team before I take this medication? They need to know if you have any of the following conditions: kidney disease an unusual or allergic reaction to prazosin, other medicines, foods, dyes, or preservatives pregnant or trying to get pregnant breast-feeding How should I use this medication? Take this drug by mouth. Take it as directed on the prescription label at the same time every day. Keep taking it unless your health care provider tells you to stop. Talk to your health care provider about the use of this drug in children. Special care may be needed. Overdosage: If you think you have taken too much of this medicine contact a poison control center or emergency room at once. NOTE: This medicine is only for you. Do not share this medicine with others. What if I miss a dose? If you miss a dose, take it as soon as you can. If it is almost time for your next dose, take only that dose. Do not take double or extra doses. What may interact with this medication? diuretics medicines for high blood pressure sildenafil tadalafil vardenafil This list may not describe all possible interactions. Give your health care provider a list of all the medicines, herbs, non-prescription drugs, or dietary supplements you use. Also tell them if you smoke, drink alcohol, or use illegal drugs. Some items may interact with your medicine. What should I watch for while using this medication? Visit your doctor or health care professional for regular checks on your progress. Check your blood pressure regularly. Ask your doctor or health care professional what your blood pressure should be and when you should contact him or  her. Drowsiness and dizziness are more likely to occur after the first dose, after an increase in dose, or during hot weather or exercise. These effects can decrease once your body adjusts to this medicine. Do not drive, use machinery, or do anything that needs mental alertness until you know how this drug affects you. Do not stand or sit up quickly, especially if you are an older patient. This reduces the risk of dizzy or fainting spells. Alcohol can make you more drowsy and dizzy. Avoid alcoholic drinks. Do not treat yourself for coughs, colds or allergies without asking your doctor or health care professional for advice. Some ingredients can increase your blood pressure. Your mouth may get dry. Chewing sugarless gum or sucking hard candy, and drinking plenty of water may help. Contact your doctor if the problem does not go away or is severe. For males, contact your doctor or health care professional right away if you have an erection that lasts longer than 4 hours or if it becomes painful. This may be a sign of a serious problem and must be treated right away to prevent permanent damage. What side effects may I notice from receiving this medication? Side effects that you should report to your doctor or health care professional as soon as possible: blurred vision difficulty breathing, shortness of breath fainting spells, light headedness fast or irregular heartbeat, palpitations or chest pain numbness in hands or feet prolonged painful erection of the penis swelling of the legs or ankles unusually weak or tired Side effects that usually do not require medical attention (report to your  doctor or health care professional if they continue or are bothersome): constipation or diarrhea headache nausea sexual difficulties stomach pain This list may not describe all possible side effects. Call your doctor for medical advice about side effects. You may report side effects to FDA at  1-800-FDA-1088. Where should I keep my medication? Keep out of the reach of children and pets. Store at room temperature between 15 and 30 degrees C (59 and 86 degrees F). Throw away any unused drug after the expiration date. NOTE: This sheet is a summary. It may not cover all possible information. If you have questions about this medicine, talk to your doctor, pharmacist, or health care provider.  2022 Elsevier/Gold Standard (2021-01-13 00:00:00)

## 2021-06-22 ENCOUNTER — Other Ambulatory Visit: Payer: Self-pay | Admitting: Psychiatry

## 2021-06-22 DIAGNOSIS — F319 Bipolar disorder, unspecified: Secondary | ICD-10-CM

## 2021-06-22 DIAGNOSIS — F4310 Post-traumatic stress disorder, unspecified: Secondary | ICD-10-CM

## 2021-07-15 ENCOUNTER — Ambulatory Visit: Payer: 59 | Admitting: Psychiatry

## 2021-07-18 ENCOUNTER — Other Ambulatory Visit: Payer: Self-pay | Admitting: Psychiatry

## 2021-07-18 DIAGNOSIS — F4310 Post-traumatic stress disorder, unspecified: Secondary | ICD-10-CM

## 2021-07-23 ENCOUNTER — Other Ambulatory Visit: Payer: Self-pay

## 2021-07-23 ENCOUNTER — Ambulatory Visit (INDEPENDENT_AMBULATORY_CARE_PROVIDER_SITE_OTHER): Payer: 59 | Admitting: Licensed Clinical Social Worker

## 2021-07-23 DIAGNOSIS — F4310 Post-traumatic stress disorder, unspecified: Secondary | ICD-10-CM

## 2021-07-24 NOTE — Progress Notes (Signed)
? ?  THERAPIST PROGRESS NOTE ? ?Session Time: 4-5p ? ?Participation Level: Active ? ?Behavioral Response: Neat and Well GroomedAlertAnxious ? ?Type of Therapy: Family Therapy ? ?Treatment Goals addressed: Problem: PTSD-Trauma Disorder CCP Problem  1 Reduce the negative impact trauma related symptoms have on social, occupational, and family functioning per pt self report 3 out of 5 sessions documented.  ? ?Goal: LTG: Reduce frequency, intensity, and duration of PTSD symptoms so daily functioning is improved: Input needed on appropriate metric.  Pt will report improvements 3 out of 5 sessions documented.  ?Outcome: Progressing ? ?Goal: STG: [name] WILL EXPERIENCE A 80% REDUCTION IN EXAGGERATED BELIEFS ABOUT SELF AND OTHERS THAT INTERFERE WITH TRAUMA RESOLUTION AS EVIDENCED BY SELF-REPORT ?Outcome: Progressing ? ?ProgressTowards Goals: Progressing ? ?Interventions: Other: TFCBT ? ?Intervention: Encourage patient to identify triggers ? ?Intervention: Encourage verbalization of feelings/concerns/expectations ? ?Intervention: REVIEW PLEASE SKILLS (TREAT PHYSICAL ILLNESS, BALANCE EATING, AVOID MOOD-ALTERING SUBSTANCES, BALANCE SLEEP AND GET EXERCISE) WITH [facility] ? ?Summary: Anna Woodard is a 31 y.o. female who presents with improving symptoms related to PTSD diagnosis. Marland Kitchen  ? ?Allowed pt to explore and express thoughts and feelings associated with recent life situations and external stressors. 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 Allowed pt to explore the experience. Discussed relationships with family members--no conflicts at this time. Explored relationship with biological father.  ? ?Continued recommendations are as follows: self care behaviors, positive social engagements, focusing on overall work/home/life balance, and focusing on positive physical and emotional wellness.  ? ? ?Suicidal/Homicidal: No ? ?Therapist Response: Anna Woodard reports  that overall symptoms have improved since last visit. Pt is doing a better job of identifying thoughts and feelings associated with traumatic memories.   ?Plan: Return again in 4 weeks. ? ?Diagnosis: PTSD (post-traumatic stress disorder) ? ?Collaboration of Care: Other pt encouraged to continue care with psychiatrist of record, Dr. Shea Evans ? ?Patient/Guardian was advised Release of Information must be obtained prior to any record release in order to collaborate their care with an outside provider. Patient/Guardian was advised if they have not already done so to contact the registration department to sign all necessary forms in order for [place] to release information regarding their care.  ? ?Consent: Patient/Guardian gives verbal consent for treatment and assignment of benefits for services provided during this visit. Patient/Guardian expressed understanding and agreed to proceed.  ? ?Ellin Fitzgibbons R Dupree Givler, LCSW ?07/24/2021 ? ?

## 2021-07-26 ENCOUNTER — Other Ambulatory Visit: Payer: Self-pay | Admitting: Psychiatry

## 2021-07-26 DIAGNOSIS — F410 Panic disorder [episodic paroxysmal anxiety] without agoraphobia: Secondary | ICD-10-CM

## 2021-07-27 NOTE — Plan of Care (Signed)
?  Problem: PTSD-Trauma Disorder CCP Problem  1 Reduce the negative impact trauma related symptoms have on social, occupational, and family functioning per pt self report 3 out of 5 sessions documented.  ?Goal: LTG: Reduce frequency, intensity, and duration of PTSD symptoms so daily functioning is improved: Input needed on appropriate metric.  Pt will report improvements 3 out of 5 sessions documented.  ?Outcome: Progressing ?Goal: STG: Anna Woodard WILL EXPERIENCE A 80% REDUCTION IN EXAGGERATED BELIEFS ABOUT SELF AND OTHERS THAT INTERFERE WITH TRAUMA RESOLUTION AS EVIDENCED BY SELF-REPORT ?Outcome: Progressing ?Intervention: Encourage patient to identify triggers ?Intervention: Encourage verbalization of feelings/concerns/expectations ?Intervention: REVIEW PLEASE SKILLS (TREAT PHYSICAL ILLNESS, BALANCE EATING, AVOID MOOD-ALTERING SUBSTANCES, BALANCE SLEEP AND GET EXERCISE) WITH Anna Woodard ?  ?

## 2021-08-10 ENCOUNTER — Other Ambulatory Visit: Payer: Self-pay | Admitting: Psychiatry

## 2021-08-10 DIAGNOSIS — F319 Bipolar disorder, unspecified: Secondary | ICD-10-CM

## 2021-08-10 DIAGNOSIS — F410 Panic disorder [episodic paroxysmal anxiety] without agoraphobia: Secondary | ICD-10-CM

## 2021-08-10 DIAGNOSIS — F4310 Post-traumatic stress disorder, unspecified: Secondary | ICD-10-CM

## 2021-08-11 ENCOUNTER — Other Ambulatory Visit: Payer: Self-pay | Admitting: Psychiatry

## 2021-08-11 ENCOUNTER — Telehealth: Payer: Self-pay | Admitting: Psychiatry

## 2021-08-11 MED ORDER — QUETIAPINE FUMARATE 100 MG PO TABS: 100.0000 mg | ORAL_TABLET | Freq: Every day | ORAL | 0 refills | Status: DC

## 2021-08-11 NOTE — Telephone Encounter (Signed)
Returned call to patient to reschedule appointment with provider from March. Patient also states she feels Seroquel is no longer effective. Transferred the call to CMA to discuss this concern. ?

## 2021-08-11 NOTE — Telephone Encounter (Signed)
Talked with the patient. She states that she has been feeling more anxious and has chest pain with intense anxiety, insomnia.  She also states that some stresses are going on lately.  She has not noticed any side effect from quetiapine; she likes that she eats more at night because it is to be an issue for her. She denies SI.  She agreed with the following.  ?-Increase quetiapine 100 mg at night along with other medication.  Discussed potential side effects, which includes increasing in appetite, drowsiness and EPS.  ?- Will forward this note to Dr. Shea Evans so that she is aware of this medication change.

## 2021-08-11 NOTE — Telephone Encounter (Signed)
pt called states that she is struggling with going to sleep.  she states she also having the chest pains again and she not wanting to leave the house or do self care like taking a bath or fixing her hair.  she like to speak with the doctor at all possible  ?

## 2021-09-06 ENCOUNTER — Other Ambulatory Visit: Payer: Self-pay | Admitting: Psychiatry

## 2021-09-06 DIAGNOSIS — F4310 Post-traumatic stress disorder, unspecified: Secondary | ICD-10-CM

## 2021-09-06 DIAGNOSIS — F410 Panic disorder [episodic paroxysmal anxiety] without agoraphobia: Secondary | ICD-10-CM

## 2021-09-07 MED ORDER — PAROXETINE HCL 20 MG PO TABS: ORAL_TABLET | ORAL | 0 refills | Status: DC

## 2021-09-07 NOTE — Telephone Encounter (Signed)
I have sent Paxil 20 mg to pharmacy. ?

## 2021-09-09 ENCOUNTER — Ambulatory Visit (INDEPENDENT_AMBULATORY_CARE_PROVIDER_SITE_OTHER): Payer: 59 | Admitting: Licensed Clinical Social Worker

## 2021-09-09 DIAGNOSIS — F319 Bipolar disorder, unspecified: Secondary | ICD-10-CM | POA: Diagnosis not present

## 2021-09-09 DIAGNOSIS — F4310 Post-traumatic stress disorder, unspecified: Secondary | ICD-10-CM | POA: Diagnosis not present

## 2021-09-09 NOTE — Progress Notes (Signed)
Virtual Visit via Audio Note ? ?I connected with Anna Woodard on 09/10/21 at  4:00 PM EDT by an audio enabled telemedicine application and verified that I am speaking with the correct person using two identifiers. ? ?Location: ?Patient: home ?Provider: [facility] ?  ?I discussed the limitations of evaluation and management by telemedicine and the availability of in person appointments. The patient expressed understanding and agreed to proceed. ?  ?I discussed the assessment and treatment plan with the patient. The patient was provided an opportunity to ask questions and all were answered. The patient agreed with the plan and demonstrated an understanding of the instructions. ?  ?The patient was advised to call back or seek an in-person evaluation if the symptoms worsen or if the condition fails to improve as anticipated. ? ?I provided 25 minutes of non-face-to-face time during this encounter. ? ? ?Anna Woodard R Seleta Hovland, LCSW ? ? ?THERAPIST PROGRESS NOTE ? ?Session Time: [id number] ?Pt 15 min late for appt--stated she knew she had appt but got distracted by her art. ? ?Participation Level: Active ? ?Behavioral Response: Neat and Well GroomedAlertAnxious ? ?Type of Therapy: Family Therapy ? ?Treatment Goals addressed: Problem: PTSD-Trauma Disorder CCP Problem  1 Reduce the negative impact trauma related symptoms have on social, occupational, and family functioning per pt self report 3 out of 5 sessions documented.  ?Goal: LTG: Reduce frequency, intensity, and duration of PTSD symptoms so daily functioning is improved: Input needed on appropriate metric.  Pt will report improvements 3 out of 5 sessions documented.  ?Outcome: Progressing ?Goal: STG: Anna Woodard WILL EXPERIENCE A 80% REDUCTION IN EXAGGERATED BELIEFS ABOUT SELF AND OTHERS THAT INTERFERE WITH TRAUMA RESOLUTION AS EVIDENCED BY SELF-REPORT ?Outcome: Progressing ?Intervention: Encourage verbalization of feelings/concerns/expectations ?Note: Allowed pt to  express ?Intervention: REVIEW PLEASE SKILLS (TREAT PHYSICAL ILLNESS, BALANCE EATING, AVOID MOOD-ALTERING SUBSTANCES, BALANCE SLEEP AND GET EXERCISE) WITH Anna Woodard ?Note: Reviewed ?Intervention: Monitor coping skills and behavior ?Note: Pt using coping skills ?Intervention: Assist with relaxation techniques, as appropriate (deep breathing exercises, meditation, guided imagery) ?  ? ?ProgressTowards Goals: Progressing ? ?Interventions: Other: TFCBT ? ?Intervention: Encourage patient to identify triggers ? ?Intervention: Encourage verbalization of feelings/concerns/expectations ? ?Intervention: REVIEW PLEASE SKILLS (TREAT PHYSICAL ILLNESS, BALANCE EATING, AVOID MOOD-ALTERING SUBSTANCES, BALANCE SLEEP AND GET EXERCISE) WITH Anna Woodard ? ?Summary: Anna Woodard is a 31 y.o. female who presents with improving symptoms related to PTSD diagnosis. Marland Kitchen  ? ?Allowed pt to explore and express thoughts and feelings associated with recent life situations and external stressors. Patient reports stress associated with recent family based conflict. Patient report said her husband has been very supportive throughout this time. Patient reports that her husband recently quit smoking and she is helping him manage his emotions. Discussed recent insomnia and medication changes. Patient reports positive effects after medication change. Patient reports good relationship with son and husband. ? ?Patient reports a good outlet for her is her art, and being creative. Patient feels that she wants to put more time and energy into her art and her overall creativity. Patient identifies this as self-care for her. ? ?Continued recommendations are as follows: self care behaviors, positive social engagements, focusing on overall work/home/life balance, and focusing on positive physical and emotional wellness.  ? ? ?Suicidal/Homicidal: No ? ?Therapist Response: Anna Woodard reports that overall symptoms have improved since last visit. Pt is doing a better job of  identifying thoughts and feelings associated with traumatic memories.  ? ?Anna Woodard continues to make good progress with overall self understanding and insight. Developments are  continuing in the areas of family and relational functioning. Anna Woodard is expressing gains and overall self-confidence and self esteem. Patient reports a reduction in overall symptoms. ?  ?Plan: Return again in 4 weeks. ? ?Diagnosis: PTSD (post-traumatic stress disorder) ? ?Bipolar and related disorder (Anna Woodard) ? ?Collaboration of Care: Other pt encouraged to continue care with psychiatrist of record, Dr. Shea Evans ? ?Patient/Guardian was advised Release of Information must be obtained prior to any record release in order to collaborate their care with an outside provider. Patient/Guardian was advised if they have not already done so to contact the registration department to sign all necessary forms in order for [place] to release information regarding their care.  ? ?Consent: Patient/Guardian gives verbal consent for treatment and assignment of benefits for services provided during this visit. Patient/Guardian expressed understanding and agreed to proceed.  ? ?Anna Woodard R Akeiba Axelson, LCSW ?09/10/2021 ? ?

## 2021-09-10 NOTE — Plan of Care (Signed)
?  Problem: PTSD-Trauma Disorder CCP Problem  1 Reduce the negative impact trauma related symptoms have on social, occupational, and family functioning per pt self report 3 out of 5 sessions documented.  ?Goal: LTG: Reduce frequency, intensity, and duration of PTSD symptoms so daily functioning is improved: Input needed on appropriate metric.  Pt will report improvements 3 out of 5 sessions documented.  ?Outcome: Progressing ?Goal: STG: Anna Woodard WILL EXPERIENCE A 80% REDUCTION IN EXAGGERATED BELIEFS ABOUT SELF AND OTHERS THAT INTERFERE WITH TRAUMA RESOLUTION AS EVIDENCED BY SELF-REPORT ?Outcome: Progressing ?Intervention: Encourage verbalization of feelings/concerns/expectations ?Note: Allowed pt to express ?Intervention: REVIEW PLEASE SKILLS (TREAT PHYSICAL ILLNESS, BALANCE EATING, AVOID MOOD-ALTERING SUBSTANCES, BALANCE SLEEP AND GET EXERCISE) WITH Anna Woodard ?Note: Reviewed ?Intervention: Monitor coping skills and behavior ?Note: Pt using coping skills ?Intervention: Assist with relaxation techniques, as appropriate (deep breathing exercises, meditation, guided imagery) ?  ?

## 2021-09-15 ENCOUNTER — Encounter: Payer: Self-pay | Admitting: Psychiatry

## 2021-09-15 ENCOUNTER — Telehealth (INDEPENDENT_AMBULATORY_CARE_PROVIDER_SITE_OTHER): Payer: 59 | Admitting: Psychiatry

## 2021-09-15 DIAGNOSIS — F1021 Alcohol dependence, in remission: Secondary | ICD-10-CM

## 2021-09-15 DIAGNOSIS — F1691 Hallucinogen use, unspecified, in remission: Secondary | ICD-10-CM

## 2021-09-15 DIAGNOSIS — F410 Panic disorder [episodic paroxysmal anxiety] without agoraphobia: Secondary | ICD-10-CM

## 2021-09-15 DIAGNOSIS — F4310 Post-traumatic stress disorder, unspecified: Secondary | ICD-10-CM | POA: Diagnosis not present

## 2021-09-15 DIAGNOSIS — F1994 Other psychoactive substance use, unspecified with psychoactive substance-induced mood disorder: Secondary | ICD-10-CM

## 2021-09-15 DIAGNOSIS — F1210 Cannabis abuse, uncomplicated: Secondary | ICD-10-CM

## 2021-09-15 DIAGNOSIS — F1611 Hallucinogen abuse, in remission: Secondary | ICD-10-CM

## 2021-09-15 MED ORDER — PAROXETINE HCL 20 MG PO TABS: ORAL_TABLET | ORAL | 0 refills | Status: DC

## 2021-09-15 MED ORDER — PRAZOSIN HCL 1 MG PO CAPS: ORAL_CAPSULE | ORAL | 0 refills | Status: DC

## 2021-09-15 MED ORDER — HYDROXYZINE PAMOATE 25 MG PO CAPS: ORAL_CAPSULE | ORAL | 0 refills | Status: AC

## 2021-09-15 MED ORDER — QUETIAPINE FUMARATE 100 MG PO TABS: ORAL_TABLET | ORAL | 0 refills | Status: DC

## 2021-09-15 NOTE — Progress Notes (Signed)
Virtual Visit via Video Note ? ?I connected with Anna Woodard on 09/15/21 at  2:30 PM EDT by a video enabled telemedicine application and verified that I am speaking with the correct person using two identifiers. ? ?Location ?Provider Location : [facility] ?Patient Location : Home ? ?Participants: Patient , Provider ? ?  ?I discussed the limitations of evaluation and management by telemedicine and the availability of in person appointments. The patient expressed understanding and agreed to proceed. ?  ?I discussed the assessment and treatment plan with the patient. The patient was provided an opportunity to ask questions and all were answered. The patient agreed with the plan and demonstrated an understanding of the instructions. ?  ?The patient was advised to call back or seek an in-person evaluation if the symptoms worsen or if the condition fails to improve as anticipated. ? ? ?[place] MD OP Progress Note ? ?09/15/2021 5:03 PM ?[facility]  ?MRN:  [id number] ? ?Chief Complaint:  ?Chief Complaint  ?Patient presents with  ? Follow-up: 31 year old Caucasian female, with history of PTSD, bipolar disorder, panic disorder, alcohol use disorder, cannabis use disorder, history of hallucinogen abuse, history of MDMA use, presented for medication management.  ? 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 ? ?Patient today reports overall mood symptoms as improving.  She denies any significant sadness or anxiety symptoms.  She reports her Seroquel 100 mg is beneficial.  The dosage was increased early April. ? ?Patient denies any side effects to the Seroquel.  She is also tolerating her  Paxil well. ? ?Patient reports sleep is good. ? ?She continues to have appetite problems, restrict certain food for certain amount of time for example she recently stopped using red meat since she felt her body could not tolerate it because she did not like the texture.  She however replaced it with chicken.  Patient reports she has been making sure she eats enough for herself. ? ?Denies any suicidality, homicidality or perceptual disturbances. ? ?Reports she currently stays away from alcohol and cannabis. ? ?Continues to follow-up with her therapist. ? ?Denies any other concerns today. ? ?Visit Diagnosis:  ?  ICD-10-CM   ?1. PTSD (post-traumatic stress disorder)  F43.10 prazosin (MINIPRESS) 1 MG capsule  ?  PARoxetine (PAXIL) 20 MG tablet  ?  ?2. 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 multiple including cannabis, alcohol - currently in remission  ?  ?3. Panic disorder  F41.0 PARoxetine (PAXIL) 20 MG tablet  ?  hydrOXYzine (VISTARIL) 25 MG capsule  ?  ?4. Alcohol use disorder, moderate, in early remission ([place])  F10.21   ?  ?5. Cannabis use disorder, mild, abuse  F12.10   ?  ?6. 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[place])  F16.11   ?  ?7. History of methylenedioxymethamphetamine (MDMA) use  F16.91   ?  ? ? ?Past Psychiatric History: Reviewed past psychiatric history from progress note on 03/04/2021.  Past trials of medications like Wellbutrin, Xanax, Valium, Prozac ? ?Past Medical History:  ?Past Medical History:  ?Diagnosis Date  ? Anxiety and depression   ? Cancer [facility])   ? CERBVICAL  ? CIN III (cervical intraepithelial neoplasia  grade III) with severe dysplasia   ? Complication of anesthesia   ? WAKES UP IN PANIC / CRYING  ? Depression   ? HPV (human papilloma virus) infection   ? Panic disorder   ? Panic disorder   ? PTSD (post-traumatic stress disorder)   ?  ?Past Surgical History:  ?Procedure Laterality Date  ? BARTHOLIN CYST MARSUPIALIZATION N/A  09/09/2017  ? Procedure: BARTHOLIN CYST MARSUPIALIZATION;  Surgeon: Will Bonnet, MD;  Location: [facility] ORS;  Service: Gynecology;  Laterality: N/A;  ? CERVICAL BIOPSY  W/ LOOP ELECTRODE EXCISION    ? DENTAL SURGERY    ? ? ?Family Psychiatric History: Reviewed family psychiatric history from progress note on 03/04/2021 ? ?Family History:  ?Family History  ?Problem Relation Age of Onset  ? Drug abuse Mother   ? Alcohol abuse Mother   ? Colon cancer Mother 25  ? Hypertension Mother   ? Stroke Mother   ? Alcohol abuse Father   ? Mental illness Father   ? Bipolar disorder Maternal Aunt   ? Mental illness Paternal Aunt   ? Mental illness Paternal Uncle   ? Suicidality Paternal Uncle   ? Seizures Maternal Grandmother   ? Breast cancer Other 35  ? Ovarian cancer Other 40  ? Suicidality Maternal Great-grandmother   ? ? ?Social History: Reviewed social history from progress note on 03/04/2021 ?Social History  ? ?Socioeconomic History  ? Marital status: Married  ?  Spouse name: [name]  ? Number of children: 2  ? Years of education: Not on file  ? Highest education level: Associate degree: occupational, Hotel manager, or vocational program  ?Occupational History  ? Not on file  ?Tobacco Use  ? Smoking status: Former  ?  Packs/day: 0.50  ?  Years: 15.00  ?  Pack years: 7.50  ?  Types: Cigarettes  ?  Quit date: 04/23/2021  ?  Years since quitting: 0.3  ? Smokeless tobacco: Never  ?Vaping Use  ? Vaping Use: Former  ?Substance and Sexual Activity  ? Alcohol use: Not Currently  ?  Comment: RARE  ? Drug use: Yes  ?  Types: Marijuana  ? Sexual activity: Yes  ?  Birth control/protection: Surgical  ?  Comment: vasectomy  ?Other Topics Concern  ? Not on file  ?Social History Narrative  ? Not on file  ? ?Social Determinants of Health  ? ?Financial Resource Strain: Not on file  ?Food Insecurity: Not on file  ?Transportation Needs: Not on file  ?Physical Activity: Not on file  ?Stress: Not on file  ?Social Connections: Not on file   ? ? ?Allergies:  ?Allergies  ?Allergen Reactions  ? Shellfish Allergy Anaphylaxis, Swelling and Other (See Comments)  ?  Eyes swelling, thickening tongue  ? Zithromax [Azithromycin] Anaphylaxis  ? Molds & Smuts   ? Naproxen   ? Other   ?  See List scanned in to Media.  ? Penicillins   ? ? ?Metabolic Disorder Labs: ?Lab Results  ?Component Value Date  ? HGBA1C 5.1 03/04/2021  ? MPG 100 03/04/2021  ? ?Lab Results  ?Component Value Date  ? PROLACTIN 5.1 03/04/2021  ? ?Lab Results  ?Component Value Date  ? CHOL 127 03/04/2021  ? TRIG 33 03/04/2021  ? HDL 46 03/04/2021  ? CHOLHDL 2.8 03/04/2021  ? VLDL 7 03/04/2021  ? Donna 74 03/04/2021  ? ?Lab Results  ?Component Value Date  ? TSH 0.508 03/04/2021  ? ? ?Therapeutic Level Labs: ?No results found  for: LITHIUM ?No results found for: VALPROATE ?No components found for:  CBMZ ? ?Current Medications: ?Current Outpatient Medications  ?Medication Sig Dispense Refill  ? acetaminophen (TYLENOL) 500 MG tablet Take 1,000 mg by mouth every 6 (six) hours as needed for moderate pain or headache.    ? ADVAIR DISKUS 250-50 MCG/DOSE AEPB Inhale 1 puff into the lungs 2 (two) times daily.    ? albuterol (VENTOLIN HFA) 108 (90 Base) MCG/ACT inhaler ProAir HFA 90 mcg/actuation aerosol inhaler ? INHALE 1 TO 2 PUFFS BY MOUTH EVERY 4 TO 6 HOURS AS NEEDED FOR COUGH OR WHEEZING    ? EPINEPHrine 0.3 mg/0.3 mL IJ SOAJ injection Inject 0.3 mg into the muscle as needed.    ? levocetirizine (XYZAL) 5 MG tablet Take 1 tablet by mouth daily.    ? montelukast (SINGULAIR) 10 MG tablet Take 10 mg by mouth daily.    ? famotidine (PEPCID) 40 MG tablet Take 1 tablet by mouth daily. (Patient not taking: Reported on 09/15/2021)    ? hydrOXYzine (VISTARIL) 25 MG capsule TAKE 1 CAPSULE(25 MG) BY MOUTH TWICE DAILY AS NEEDED FOR ANXIETY OR SEVERE ANXIETY 180 capsule 0  ? [START ON 10/07/2021] PARoxetine (PAXIL) 20 MG tablet TAKE 1 TABLET(20 MG) BY MOUTH DAILY WITH BREAKFAST 90 tablet 0  ? prazosin (MINIPRESS) 1 MG  capsule TAKE 1 CAPSULE(1 MG) BY MOUTH AT BEDTIME FOR NIGHTMARES 90 capsule 0  ? [START ON 10/07/2021] QUEtiapine (SEROQUEL) 100 MG tablet TAKE 1 TABLET(100 MG) BY MOUTH AT BEDTIME 90 tablet 0  ? ?No current fa

## 2021-09-23 ENCOUNTER — Encounter: Payer: 59 | Attending: Psychology | Admitting: Psychology

## 2021-09-23 DIAGNOSIS — F410 Panic disorder [episodic paroxysmal anxiety] without agoraphobia: Secondary | ICD-10-CM | POA: Diagnosis present

## 2021-09-23 DIAGNOSIS — F319 Bipolar disorder, unspecified: Secondary | ICD-10-CM | POA: Diagnosis present

## 2021-09-23 DIAGNOSIS — F4310 Post-traumatic stress disorder, unspecified: Secondary | ICD-10-CM | POA: Diagnosis present

## 2021-10-15 ENCOUNTER — Encounter: Payer: 59 | Attending: Psychology

## 2021-10-15 DIAGNOSIS — F410 Panic disorder [episodic paroxysmal anxiety] without agoraphobia: Secondary | ICD-10-CM | POA: Insufficient documentation

## 2021-10-15 DIAGNOSIS — F4310 Post-traumatic stress disorder, unspecified: Secondary | ICD-10-CM | POA: Insufficient documentation

## 2021-10-15 DIAGNOSIS — F319 Bipolar disorder, unspecified: Secondary | ICD-10-CM | POA: Insufficient documentation

## 2021-10-17 ENCOUNTER — Other Ambulatory Visit: Payer: Self-pay | Admitting: Psychiatry

## 2021-10-17 DIAGNOSIS — F4310 Post-traumatic stress disorder, unspecified: Secondary | ICD-10-CM

## 2021-10-17 DIAGNOSIS — F410 Panic disorder [episodic paroxysmal anxiety] without agoraphobia: Secondary | ICD-10-CM

## 2021-10-29 ENCOUNTER — Ambulatory Visit (INDEPENDENT_AMBULATORY_CARE_PROVIDER_SITE_OTHER): Payer: 59 | Admitting: Licensed Clinical Social Worker

## 2021-10-29 DIAGNOSIS — F4310 Post-traumatic stress disorder, unspecified: Secondary | ICD-10-CM | POA: Diagnosis not present

## 2021-10-29 NOTE — Plan of Care (Signed)
  Problem: PTSD-Trauma Disorder CCP Problem  1 Reduce the negative impact trauma related symptoms have on social, occupational, and family functioning per pt self report 3 out of 5 sessions documented.  Goal: LTG: Reduce frequency, intensity, and duration of PTSD symptoms so daily functioning is improved: Input needed on appropriate metric.  Pt will report improvements 3 out of 5 sessions documented.  Outcome: Progressing Goal: STG: Anna Woodard WILL EXPERIENCE A 80% REDUCTION IN EXAGGERATED BELIEFS ABOUT SELF AND OTHERS THAT INTERFERE WITH TRAUMA RESOLUTION AS EVIDENCED BY SELF-REPORT Outcome: Progressing
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
## 2021-11-07 ENCOUNTER — Telehealth: Payer: 59 | Admitting: Emergency Medicine

## 2021-11-07 DIAGNOSIS — L249 Irritant contact dermatitis, unspecified cause: Secondary | ICD-10-CM | POA: Diagnosis not present

## 2021-11-07 MED ORDER — PREDNISONE 10 MG (21) PO TBPK: ORAL_TABLET | ORAL | 0 refills | Status: DC

## 2021-11-07 NOTE — Patient Instructions (Signed)
[facility], thank you for joining Carvel Getting, NP for today's virtual visit.  While this provider is not your primary care provider (PCP), if your PCP is located in our provider database this encounter information will be shared with them immediately following your visit.  Consent: (Patient) Anna Woodard provided verbal consent for this virtual visit at the beginning of the encounter.  Current Medications:  Current Outpatient Medications:    predniSONE (STERAPRED UNI-PAK 21 TAB) 10 MG (21) TBPK tablet, Take 6 tabs on day 1; take 5 tabs day 2; take 4 tabs day 3; take 3 tabs day 4; take 2 tabs day 5; take 1 tab day 6, Disp: 21 tablet, Rfl: 0   acetaminophen (TYLENOL) 500 MG tablet, Take 1,000 mg by mouth every 6 (six) hours as needed for moderate pain or headache., Disp: , Rfl:    ADVAIR DISKUS 250-50 MCG/DOSE AEPB, Inhale 1 puff into the lungs 2 (two) times daily., Disp: , Rfl:    albuterol (VENTOLIN HFA) 108 (90 Base) MCG/ACT inhaler, ProAir HFA 90 mcg/actuation aerosol inhaler  INHALE 1 TO 2 PUFFS BY MOUTH EVERY 4 TO 6 HOURS AS NEEDED FOR COUGH OR WHEEZING, Disp: , Rfl:    EPINEPHrine 0.3 mg/0.3 mL IJ SOAJ injection, Inject 0.3 mg into the muscle as needed., Disp: , Rfl:    famotidine (PEPCID) 40 MG tablet, Take 1 tablet by mouth daily. (Patient not taking: Reported on 09/15/2021), Disp: , Rfl:    hydrOXYzine (VISTARIL) 25 MG capsule, TAKE 1 CAPSULE(25 MG) BY MOUTH TWICE DAILY AS NEEDED FOR ANXIETY OR SEVERE ANXIETY, Disp: 180 capsule, Rfl: 0   levocetirizine (XYZAL) 5 MG tablet, Take 1 tablet by mouth daily., Disp: , Rfl:    montelukast (SINGULAIR) 10 MG tablet, Take 10 mg by mouth daily., Disp: , Rfl:    PARoxetine (PAXIL) 20 MG tablet, TAKE 1 TABLET(20 MG) BY MOUTH DAILY WITH BREAKFAST, Disp: 90 tablet, Rfl: 0   prazosin (MINIPRESS) 1 MG capsule, TAKE 1 CAPSULE(1 MG) BY MOUTH AT BEDTIME FOR NIGHTMARES, Disp: 90 capsule, Rfl: 0   QUEtiapine (SEROQUEL) 100 MG tablet, TAKE 1 TABLET(100 MG)  BY MOUTH AT BEDTIME, Disp: 90 tablet, Rfl: 0   Medications ordered in this encounter:  Meds ordered this encounter  Medications   predniSONE (STERAPRED UNI-PAK 21 TAB) 10 MG (21) TBPK tablet    Sig: Take 6 tabs on day 1; take 5 tabs day 2; take 4 tabs day 3; take 3 tabs day 4; take 2 tabs day 5; take 1 tab day 6    Dispense:  21 tablet    Refill:  0     *If you need refills on other medications prior to your next appointment, please contact your pharmacy*  Follow-Up: Call back or seek an in-person evaluation if the symptoms worsen or if the condition fails to improve as anticipated.  Other Instructions Wash your wrist guards. Consider not wearing them for one night to see if that helps your rash heal.   Continue using hydrocortisone cream on your rash. The oral prednisone should help clear it up as well.    If you have been instructed to have an in-person evaluation today at a local Urgent Care facility, please use the link below. It will take you to a list of all of our available [facility], including address, phone number and hours of operation. Please do not delay care.  [facility]  If you or a family member do  not have a primary care provider, use the link below to schedule a visit and establish care. When you choose a [facility] primary care physician or advanced practice provider, you gain a long-term partner in health. Find a Primary Care Provider  Learn more about [place]'s in-office and virtual care options: [facility]

## 2021-11-07 NOTE — Progress Notes (Signed)
Virtual Visit Consent   [name], you are scheduled for a virtual visit with a [facility] provider today. Just as with appointments in the office, your consent must be obtained to participate. Your consent will be active for this visit and any virtual visit you may have with one of our providers in the next 365 days. If you have a MyChart account, a copy of this consent can be sent to you electronically.  As this is a virtual visit, video technology does not allow for your provider to perform a traditional examination. This may limit your provider's ability to fully assess your condition. If your provider identifies any concerns that need to be evaluated in person or the need to arrange testing (such as labs, EKG, etc.), we will make arrangements to do so. Although advances in technology are sophisticated, we cannot ensure that it will always work on either your end or our end. If the connection with a video visit is poor, the visit may have to be switched to a telephone visit. With either a video or telephone visit, we are not always able to ensure that we have a secure connection.  By engaging in this virtual visit, you consent to the provision of healthcare and authorize for your insurance to be billed (if applicable) for the services provided during this visit. Depending on your insurance coverage, you may receive a charge related to this service.  I need to obtain your verbal consent now. Are you willing to proceed with your visit today? Anna Woodard has provided verbal consent on 11/07/2021 for a virtual visit (video or telephone). Carvel Getting, NP  Date: 11/07/2021 1:48 PM  Virtual Visit via Video Note   I, Carvel Getting, connected with  Anna Woodard  ([id number], 1990/10/12) on 11/07/21 at  1:45 PM EDT by a video-enabled telemedicine application and verified that I am speaking with the correct person using two identifiers.  Location: Patient: Virtual Visit Location Patient:  Home Provider: Virtual Visit Location Provider: Home Office   I discussed the limitations of evaluation and management by telemedicine and the availability of in person appointments. The patient expressed understanding and agreed to proceed.    History of Present Illness: Anna Woodard is a 31 y.o. who identifies as a female who was assigned female at birth, and is being seen today for rash. Had strep recently and when she had a fever from strep, developed a small area of rash on B wrists under her wristguards she wears every night.  The rash appeared 4 days ago and initially consisted of flat red spots in a small group.  Gradually the rash turned to blisters that were fluid-filled and then broke open, oozing clear fluid.  Reports rash appears to be spreading.  Denies any purulent drainage.  Denies erythema around the area.  Reports areas are very itchy and not relieved by hydroxyzine.  Reports she has very sensitive skin and frequently has allergic type skin reactions.  Has been using hydrocortisone on the rash areas for temporary relief of itching.  Rashes only in 2 areas 1 on each wrist, that were under wrist guards.  Denies change of lotions or soaps or detergents recently.  Denies contact with any irritants such as poison ivy.  HPI: HPI  Problems:  Patient Active Problem List   Diagnosis Date Noted   Alcohol use disorder, moderate, in early remission ([place]) 09/15/2021   Bipolar and related disorder ([place]) 04/01/2021   Bipolar 1 disorder, mixed ([place])  03/04/2021   PTSD (post-traumatic stress disorder) 03/04/2021   Panic disorder 03/04/2021   Alcohol use disorder, moderate, dependence (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[place]) 03/04/2021   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   At risk for prolonged QT interval syndrome 03/04/2021   High risk medication use 03/04/2021   Tobacco use disorder 03/04/2021   Anxiety  and depression    Single liveborn infant delivered vaginally 03/18/2020   6 weeks postpartum follow-up 03/18/2020   Encounter for care or examination of lactating mother 03/18/2020   Encounter for elective induction of labor 03/17/2020   Labor and delivery, indication for care 02/18/2020   [redacted] weeks gestation of pregnancy 02/18/2020   Choroid plexus cyst, fetal, affecting care of mother, antepartum, single gestation 10/25/2019   Supervision of high risk pregnancy, antepartum 08/14/2019   History of loop electrosurgical excision procedure (LEEP) of cervix affecting pregnancy, antepartum 08/14/2019   Bartholin's gland abscess 09/09/2017    Allergies:  Allergies  Allergen Reactions   Shellfish Allergy Anaphylaxis, Swelling and Other (See Comments)    Eyes swelling, thickening tongue   Zithromax [Azithromycin] Anaphylaxis   Molds & Smuts    Naproxen    Other     See List scanned in to Media.   Penicillins    Medications:  Current Outpatient Medications:    predniSONE (STERAPRED UNI-PAK 21 TAB) 10 MG (21) TBPK tablet, Take 6 tabs on day 1; take 5 tabs day 2; take 4 tabs day 3; take 3 tabs day 4; take 2 tabs day 5; take 1 tab day 6, Disp: 21 tablet, Rfl: 0   acetaminophen (TYLENOL) 500 MG tablet, Take 1,000 mg by mouth every 6 (six) hours as needed for moderate pain or headache., Disp: , Rfl:    ADVAIR DISKUS 250-50 MCG/DOSE AEPB, Inhale 1 puff into the lungs 2 (two) times daily., Disp: , Rfl:    albuterol (VENTOLIN HFA) 108 (90 Base) MCG/ACT inhaler, ProAir HFA 90 mcg/actuation aerosol inhaler  INHALE 1 TO 2 PUFFS BY MOUTH EVERY 4 TO 6 HOURS AS NEEDED FOR COUGH OR WHEEZING, Disp: , Rfl:    EPINEPHrine 0.3 mg/0.3 mL IJ SOAJ injection, Inject 0.3 mg into the muscle as needed., Disp: , Rfl:    famotidine (PEPCID) 40 MG tablet, Take 1 tablet by mouth daily. (Patient not taking: Reported on 09/15/2021), Disp: , Rfl:    hydrOXYzine (VISTARIL) 25 MG capsule, TAKE 1 CAPSULE(25 MG) BY MOUTH TWICE  DAILY AS NEEDED FOR ANXIETY OR SEVERE ANXIETY, Disp: 180 capsule, Rfl: 0   levocetirizine (XYZAL) 5 MG tablet, Take 1 tablet by mouth daily., Disp: , Rfl:    montelukast (SINGULAIR) 10 MG tablet, Take 10 mg by mouth daily., Disp: , Rfl:    PARoxetine (PAXIL) 20 MG tablet, TAKE 1 TABLET(20 MG) BY MOUTH DAILY WITH BREAKFAST, Disp: 90 tablet, Rfl: 0   prazosin (MINIPRESS) 1 MG capsule, TAKE 1 CAPSULE(1 MG) BY MOUTH AT BEDTIME FOR NIGHTMARES, Disp: 90 capsule, Rfl: 0   QUEtiapine (SEROQUEL) 100 MG tablet, TAKE 1 TABLET(100 MG) BY MOUTH AT BEDTIME, Disp: 90 tablet, Rfl: 0  Observations/Objective: Patient is well-developed, well-nourished in no acute distress.  Resting comfortably  at home.  Head is normocephalic, atraumatic.  No labored breathing.  Speech is clear and coherent with logical content.  Patient is alert and oriented at baseline.  On video, I can see very small cluster of what appeared to be open healing  areas of skin that could be healing former vesicles.  The area on each wrist is lateral and the 2 clusters are approximately 1 inch total in diameter  Assessment and Plan: 1. Irritant contact dermatitis, unspecified trigger  Although patient denies any exposure to poison ivy it really does look like a contact dermatitis on video.  Patient is to continue using her hydrocortisone cream.  I will prescribe prednisone.  Patient reports she has had similar skin irritation in the past that has been resolved by prednisone.  Patient is to wash her wrist guards in case there is something in them that she is coming contact with that is causing the problem.  Follow Up Instructions: I discussed the assessment and treatment plan with the patient. The patient was provided an opportunity to ask questions and all were answered. The patient agreed with the plan and demonstrated an understanding of the instructions.  A copy of instructions were sent to the patient via MyChart unless otherwise noted below.    The patient was advised to call back or seek an in-person evaluation if the symptoms worsen or if the condition fails to improve as anticipated.  Time:  I spent 8 minutes with the patient via telehealth technology discussing the above problems/concerns.    Carvel Getting, NP

## 2021-11-24 ENCOUNTER — Encounter: Payer: Self-pay | Admitting: Psychology
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
## 2021-12-15 ENCOUNTER — Encounter: Payer: 59 | Attending: Psychology

## 2021-12-15 DIAGNOSIS — F410 Panic disorder [episodic paroxysmal anxiety] without agoraphobia: Secondary | ICD-10-CM | POA: Insufficient documentation

## 2021-12-15 DIAGNOSIS — F4310 Post-traumatic stress disorder, unspecified: Secondary | ICD-10-CM | POA: Insufficient documentation

## 2021-12-15 DIAGNOSIS — F319 Bipolar disorder, unspecified: Secondary | ICD-10-CM | POA: Insufficient documentation

## 2021-12-17 ENCOUNTER — Telehealth: Payer: Self-pay | Admitting: Psychiatry

## 2021-12-17 ENCOUNTER — Telehealth (INDEPENDENT_AMBULATORY_CARE_PROVIDER_SITE_OTHER): Payer: Self-pay | Admitting: Psychiatry

## 2021-12-17 DIAGNOSIS — F4310 Post-traumatic stress disorder, unspecified: Secondary | ICD-10-CM

## 2021-12-17 NOTE — Progress Notes (Signed)
No response to call or text or video invite. When patient did not respond to the video invite , attempted to contact by phone-[phone]  Phone Ringing, no voicemail to leave a message, also call could not be completed message. 15 minutes provided for patient to connect on the telemedicine platform.  However patient did not connect.   Communicated with front office staff as well as CMA .

## 2021-12-17 NOTE — Telephone Encounter (Signed)
Patient missed MyChart visit. Tried calling the number provided [phone], but no answer with a message stating call cannot be connected. Sent patient a MyChart message also to call and reschedule missed appointment.

## 2021-12-29 ENCOUNTER — Telehealth (HOSPITAL_COMMUNITY): Payer: Self-pay | Admitting: Licensed Clinical Social Worker

## 2021-12-29 ENCOUNTER — Ambulatory Visit (INDEPENDENT_AMBULATORY_CARE_PROVIDER_SITE_OTHER): Payer: Self-pay | Admitting: Licensed Clinical Social Worker

## 2021-12-29 DIAGNOSIS — Z91199 Patient's noncompliance with other medical treatment and regimen due to unspecified reason: Secondary | ICD-10-CM

## 2021-12-29 NOTE — Progress Notes (Signed)
LCSW counselor attempted to connect with patient for scheduled appointment via MyChart video text request x 2 and email request with no response; also attempted to connect via phone without success. LCSW counselor unable to leave message at time of phone call.  Attempt 1: Text and email: 11:08  Attempt 2: Text and email: 11:12a  Attempt 3: phone call: 11:16 x 2   Could not leave voice mail.  Visit will be coded as no show

## 2021-12-29 NOTE — Telephone Encounter (Signed)
LCSW counselor attempted to connect with patient for scheduled appointment via MyChart video text request x 2 and email request with no response; also attempted to connect via phone without success. LCSW counselor unable to leave message at time of phone call.  Attempt 1: Text and email: 11:08  Attempt 2: Text and email: 11:12a  Attempt 3: phone call: 11:16 x 2   Could not leave voice mail.  Visit will be coded as no show

## 2022-01-02 ENCOUNTER — Emergency Department
Admission: EM | Admit: 2022-01-02 | Discharge: 2022-01-02 | Disposition: A | Discharge status: Home / Self Care | Payer: 59 | Attending: Emergency Medicine | Admitting: Emergency Medicine

## 2022-01-02 ENCOUNTER — Telehealth: Payer: 59 | Admitting: Nurse Practitioner

## 2022-01-02 ENCOUNTER — Encounter: Payer: Self-pay | Admitting: Emergency Medicine

## 2022-01-02 ENCOUNTER — Other Ambulatory Visit: Payer: Self-pay

## 2022-01-02 DIAGNOSIS — F4310 Post-traumatic stress disorder, unspecified: Secondary | ICD-10-CM

## 2022-01-02 DIAGNOSIS — F419 Anxiety disorder, unspecified: Secondary | ICD-10-CM | POA: Diagnosis not present

## 2022-01-02 DIAGNOSIS — F410 Panic disorder [episodic paroxysmal anxiety] without agoraphobia: Secondary | ICD-10-CM

## 2022-01-02 DIAGNOSIS — Z87891 Personal history of nicotine dependence: Secondary | ICD-10-CM | POA: Insufficient documentation

## 2022-01-02 DIAGNOSIS — Z8541 Personal history of malignant neoplasm of cervix uteri: Secondary | ICD-10-CM | POA: Insufficient documentation

## 2022-01-02 DIAGNOSIS — F32A Depression, unspecified: Secondary | ICD-10-CM | POA: Diagnosis present

## 2022-01-02 DIAGNOSIS — F3160 Bipolar disorder, current episode mixed, unspecified: Secondary | ICD-10-CM | POA: Diagnosis not present

## 2022-01-02 DIAGNOSIS — F99 Mental disorder, not otherwise specified: Secondary | ICD-10-CM

## 2022-01-02 LAB — URINE DRUG SCREEN, QUALITATIVE (ARMC ONLY)
Amphetamines, Ur Screen: NOT DETECTED
Barbiturates, Ur Screen: NOT DETECTED
Benzodiazepine, Ur Scrn: NOT DETECTED
Cannabinoid 50 Ng, Ur ~~LOC~~: POSITIVE — AB
Cocaine Metabolite,Ur ~~LOC~~: NOT DETECTED
MDMA (Ecstasy)Ur Screen: NOT DETECTED
Methadone Scn, Ur: NOT DETECTED
Opiate, Ur Screen: NOT DETECTED
Phencyclidine (PCP) Ur S: NOT DETECTED
Tricyclic, Ur Screen: NOT DETECTED

## 2022-01-02 LAB — COMPREHENSIVE METABOLIC PANEL
ALT: 25 U/L (ref 0–44)
AST: 25 U/L (ref 15–41)
Albumin: 4.6 g/dL (ref 3.5–5.0)
Alkaline Phosphatase: 73 U/L (ref 38–126)
Anion gap: 10 (ref 5–15)
BUN: 7 mg/dL (ref 6–20)
CO2: 24 mmol/L (ref 22–32)
Calcium: 9.5 mg/dL (ref 8.9–10.3)
Chloride: 106 mmol/L (ref 98–111)
Creatinine, Ser: 0.71 mg/dL (ref 0.44–1.00)
GFR, Estimated: >60 mL/min (ref >60)
Glucose, Bld: 121 mg/dL — ABNORMAL HIGH (ref 70–99)
Potassium: 3.3 mmol/L — ABNORMAL LOW (ref 3.5–5.1)
Sodium: 140 mmol/L (ref 135–145)
Total Bilirubin: 1.1 mg/dL (ref 0.3–1.2)
Total Protein: 7.5 g/dL (ref 6.5–8.1)

## 2022-01-02 LAB — ETHANOL: Alcohol, Ethyl (B): <10 mg/dL (ref <10)

## 2022-01-02 LAB — CBC
HCT: 42.2 % (ref 36.0–46.0)
Hemoglobin: 14.6 g/dL (ref 12.0–15.0)
MCH: 30.4 pg (ref 26.0–34.0)
MCHC: 34.6 g/dL (ref 30.0–36.0)
MCV: 87.9 fL (ref 80.0–100.0)
Platelets: 354 10*3/uL (ref 150–400)
RBC: 4.8 MIL/uL (ref 3.87–5.11)
RDW: 12.6 % (ref 11.5–15.5)
WBC: 5.9 10*3/uL (ref 4.0–10.5)
nRBC: 0 % (ref 0.0–0.2)

## 2022-01-02 LAB — POC URINE PREG, ED: Preg Test, Ur: NEGATIVE

## 2022-01-02 LAB — SALICYLATE LEVEL: Salicylate Lvl: <7 mg/dL — ABNORMAL LOW (ref 7.0–30.0)

## 2022-01-02 LAB — ACETAMINOPHEN LEVEL: Acetaminophen (Tylenol), Serum: <10 ug/mL — ABNORMAL LOW (ref 10–30)

## 2022-01-02 MED ORDER — POTASSIUM CHLORIDE CRYS ER 20 MEQ PO TBCR
40.0000 meq | EXTENDED_RELEASE_TABLET | Freq: Once | ORAL | Status: AC
  Administered 2022-01-02: 40 meq via ORAL
  Filled 2022-01-02: qty 2

## 2022-01-02 MED ORDER — HYDROXYZINE HCL 10 MG PO TABS: 25.0000 mg | ORAL_TABLET | Freq: Four times a day (QID) | ORAL | Status: DC | PRN

## 2022-01-02 NOTE — ED Notes (Signed)
Pt was offered to go to room 22 as pt has mentioned to staff to, "feeling anxious." When pt walked to the room, pt stated, "I want to go back out there. This room is scary."
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
## 2022-01-02 NOTE — ED Triage Notes (Signed)
Pt via POV from home. Pt states that she has a hx of PTSD, depression, anxiety, and borderline Bipolar disorders. States that last week she was catatonic for a couple of days and states that she missed all her psych appts. States that she did smoke weed this AM to see if that would help but it didn't. When asked if she had feelings of SI, states "I do but I know that I am not suppose to." Pt is alert but tearful during triage.   Pt belongings include:  1 green jacket  1 black t-shirt  1 black leggings 1 pair of black slides 1 pair of black socks 2 ring 1 nose rings   3 pair of earrings 1 yellow purse  (Husband took pts belongings with him)

## 2022-01-02 NOTE — Patient Instructions (Addendum)
Dial 988 for crisis lifeline If you need immediate attention please call 911 if someone can not transport you to the emergency room   [facility], thank you for joining Gildardo Pounds, NP for today's virtual visit.  While this provider is not your primary care provider (PCP), if your PCP is located in our provider database this encounter information will be shared with them immediately following your visit.  Consent: (Patient) Anna Woodard provided verbal consent for this virtual visit at the beginning of the encounter.  Current Medications:  Current Outpatient Medications:    acetaminophen (TYLENOL) 500 MG tablet, Take 1,000 mg by mouth every 6 (six) hours as needed for moderate pain or headache., Disp: , Rfl:    ADVAIR DISKUS 250-50 MCG/DOSE AEPB, Inhale 1 puff into the lungs 2 (two) times daily., Disp: , Rfl:    albuterol (VENTOLIN HFA) 108 (90 Base) MCG/ACT inhaler, ProAir HFA 90 mcg/actuation aerosol inhaler  INHALE 1 TO 2 PUFFS BY MOUTH EVERY 4 TO 6 HOURS AS NEEDED FOR COUGH OR WHEEZING, Disp: , Rfl:    EPINEPHrine 0.3 mg/0.3 mL IJ SOAJ injection, Inject 0.3 mg into the muscle as needed., Disp: , Rfl:    famotidine (PEPCID) 40 MG tablet, Take 1 tablet by mouth daily. (Patient not taking: Reported on 09/15/2021), Disp: , Rfl:    hydrOXYzine (VISTARIL) 25 MG capsule, TAKE 1 CAPSULE(25 MG) BY MOUTH TWICE DAILY AS NEEDED FOR ANXIETY OR SEVERE ANXIETY, Disp: 180 capsule, Rfl: 0   levocetirizine (XYZAL) 5 MG tablet, Take 1 tablet by mouth daily., Disp: , Rfl:    montelukast (SINGULAIR) 10 MG tablet, Take 10 mg by mouth daily., Disp: , Rfl:    PARoxetine (PAXIL) 20 MG tablet, TAKE 1 TABLET(20 MG) BY MOUTH DAILY WITH BREAKFAST, Disp: 90 tablet, Rfl: 0   prazosin (MINIPRESS) 1 MG capsule, TAKE 1 CAPSULE(1 MG) BY MOUTH AT BEDTIME FOR NIGHTMARES, Disp: 90 capsule, Rfl: 0   predniSONE (STERAPRED UNI-PAK 21 TAB) 10 MG (21) TBPK tablet, Take 6 tabs on day 1; take 5 tabs day 2; take 4 tabs day 3; take 3  tabs day 4; take 2 tabs day 5; take 1 tab day 6, Disp: 21 tablet, Rfl: 0   QUEtiapine (SEROQUEL) 100 MG tablet, TAKE 1 TABLET(100 MG) BY MOUTH AT BEDTIME, Disp: 90 tablet, Rfl: 0   Medications ordered in this encounter:  No orders of the defined types were placed in this encounter.    *If you need refills on other medications prior to your next appointment, please contact your pharmacy*  Follow-Up: Call back or seek an in-person evaluation if the symptoms worsen or if the condition fails to improve as anticipated.  Other Instructions SEE ABOVE   If you have been instructed to have an in-person evaluation today at a local Urgent Care facility, please use the link below. It will take you to a list of all of our available [facility], including address, phone number and hours of operation. Please do not delay care.  [facility]  If you or a family member do not have a primary care provider, use the link below to schedule a visit and establish care. When you choose a [facility] primary care physician or advanced practice provider, you gain a long-term partner in health. Find a Primary Care Provider  Learn more about [facility]'s in-office and virtual care options: [facility]

## 2022-01-02 NOTE — ED Notes (Signed)
Phone provided

## 2022-01-02 NOTE — ED Provider Notes (Signed)
[facility] Provider Note    Event Date/Time   First MD Initiated Contact with Patient 01/02/22 1245     (approximate)   History   Depression   HPI  Anna Woodard is a 31 y.o. female with PTSD, depression, anxiety, and borderline bipolar who comes in with psych concern. Pt reports catanoic for a few days and missed her psych appointment. Did use THC this AM.   Patient denies having any SI but she reports having these thoughts of her arms aching and needing to rip them open but she states that she does not want to rip, but she does not want to hurt herself and she is not sure why she is having all of these strange thoughts.  She reports being compliant with her medications.  Denies any fevers, abdominal pain or other concerns.      Physical Exam   Triage Vital Signs: ED Triage Vitals  Enc Vitals Group     BP 01/02/22 1111 (!) 152/103     Pulse Rate 01/02/22 1111 (!) 114     Resp 01/02/22 1111 20     Temp 01/02/22 1111 98.1 F (36.7 C)     Temp Source 01/02/22 1111 Oral     SpO2 01/02/22 1111 98 %     Weight 01/02/22 1112 161 lb (73 kg)     Height 01/02/22 1112 '5\' 6"'$  (1.676 m)     Head Circumference --      Peak Flow --      Pain Score 01/02/22 1112 0     Pain Loc --      Pain Edu? --      Excl. in [place]? --     Most recent vital signs: Vitals:   01/02/22 1111  BP: (!) 152/103  Pulse: (!) 114  Resp: 20  Temp: 98.1 F (36.7 C)  SpO2: 98%     General: Awake, no distress.  CV:  Good peripheral perfusion.  Resp:  Normal effort.  Abd:  No distention.  Soft and nontender Other:  Patient is tearful   ED Results / Procedures / Treatments   Labs (all labs ordered are listed, but only abnormal results are displayed) Labs Reviewed  COMPREHENSIVE METABOLIC PANEL - Abnormal; Notable for the following components:      Result Value   Potassium 3.3 (*)    Glucose, Bld 121 (*)    All other components within normal limits  SALICYLATE LEVEL -  Abnormal; Notable for the following components:   Salicylate Lvl <0.6 (*)    All other components within normal limits  ACETAMINOPHEN LEVEL - Abnormal; Notable for the following components:   Acetaminophen (Tylenol), Serum <10 (*)    All other components within normal limits  URINE DRUG SCREEN, QUALITATIVE ([facility] ONLY) - Abnormal; Notable for the following components:   Cannabinoid 50 Ng, Ur [place] POSITIVE (*)    All other components within normal limits  ETHANOL  CBC  POC URINE PREG, ED      PROCEDURES:  Critical Care performed: No  Procedures   MEDICATIONS ORDERED IN ED: Medications - No data to display   IMPRESSION / MDM / [place] / ED COURSE  I reviewed the triage vital signs and the nursing notes.   Patient's presentation is most consistent with acute presentation with potential threat to life or bodily function.    Pt is without any acute medical complaints. No exam findings to suggest medical cause of current presentation.  Will order psychiatric screening labs and discuss further w/ psychiatric service.  CMP shows slightly low potassium we will give some oral repletion.  Pregnancy test negative.  Urine drug positive for THC.  EtOH negative salicylate negative Tylenol negative CBC reassuring.  Patient is willing to stay voluntary she denies any active SI therefore we will leave her voluntary to be seen by psychiatry  D/d includes but is not limited to psychiatric disease, behavioral/personality disorder, inadequate socioeconomic support, medical.  Based on HPI, exam, unremarkable labs, no concern for acute medical problem at this time. No rigidity, clonus, hyperthermia, focal neurologic deficit, diaphoresis, tachycardia, meningismus, ataxia, gait abnormality or other finding to suggest this visit represents a non-psychiatric problem. Screening labs reviewed.    Given this, pt medically cleared, to be dispositioned per Psych.    The patient has been placed  in psychiatric observation due to the need to provide a safe environment for the patient while obtaining psychiatric consultation and evaluation, as well as ongoing medical and medication management to treat the patient's condition.  The patient has not been placed under full IVC at this time.     FINAL CLINICAL IMPRESSION(S) / ED DIAGNOSES   Final diagnoses:  Mental health disorder     Rx / DC Orders   ED Discharge Orders     None        Note:  This document was prepared using Dragon voice recognition software and may include unintentional dictation errors.   Vanessa [place], MD 01/02/22 1255

## 2022-01-02 NOTE — ED Provider Notes (Signed)
Procedures     ----------------------------------------- 3:21 PM on 01/02/2022 ----------------------------------------- Care discussed with psychiatry who has cleared the patient for discharge     Carrie Mew, MD 01/02/22 1521

## 2022-01-02 NOTE — ED Notes (Signed)
Lunch tray offered. Pt refused to eat at this moment. Drink provided.

## 2022-01-02 NOTE — Consult Note (Signed)
[place] Psychiatry Consult   Reason for Consult:  anxiety, panic attacks Referring Physician:  EDP Patient Identification: Anna Woodard MRN:  [id number] Principal Diagnosis: Bipolar 1 disorder, mixed ([place]) Diagnosis:  Principal Problem:   Bipolar 1 disorder, mixed ([place])   Total Time spent with patient: 45 minutes  Subjective:   Anna Woodard is a 31 y.o. female patient admitted with anxiety and somatic feelings that her bones feel like they are coming out of her body despite knowing this is not the case.  HPI:  31 yo female presented with anxiety and reports she has been "out of sorts" for the past two weeks that started with panic attacks then catatonia where she missed her psych appointments with Dr Shea Evans.  Her definition of catatonia is staying in bed and self-isolating at times.  She does report practicing yoga many days as she feels her bones are needing to bend or are trying to leave her body.  Initially she was much more anxious and calmed significantly when talking to her after speaking with her husband.  She does not want to be admitted, no suicidal ideations.  Her husband, Anna Woodard, was contacted with her permission and has no safety concerns that she will harm herself.  He reports she has "gone through a lot and tries to stay on top of her meds and psych stuff."  He confirms some self-isolation.  No homicidal ideations or psychosis on assessment.  She does report hearing the television and it is not on at times.  Uses cannabis at times, last use earlier today "to calm" herself.  Psych cleared for outpatient services with her provider.  Past Psychiatric History: PTSD, bipolar d/o  Risk to Self:  none Risk to Others:  none Prior Inpatient Therapy:  none Prior Outpatient Therapy:  Dr Shea Evans  Past Medical History:  Past Medical History:  Diagnosis Date   Anxiety and depression    Cancer ([place])    CERBVICAL   CIN III (cervical intraepithelial neoplasia grade III) with  severe dysplasia    Complication of anesthesia    WAKES UP IN PANIC / CRYING   Depression    HPV (human papilloma virus) infection    Panic disorder    Panic disorder    PTSD (post-traumatic stress disorder)     Past Surgical History:  Procedure Laterality Date   BARTHOLIN CYST MARSUPIALIZATION N/A 09/09/2017   Procedure: BARTHOLIN CYST MARSUPIALIZATION;  Surgeon: Will Bonnet, MD;  Location: [facility] ORS;  Service: Gynecology;  Laterality: N/A;   CERVICAL BIOPSY  W/ LOOP ELECTRODE EXCISION     DENTAL SURGERY     Family History:  Family History  Problem Relation Age of Onset   Drug abuse Mother    Alcohol abuse Mother    Colon cancer Mother 93   Hypertension Mother    Stroke Mother    Alcohol abuse Father    Mental illness Father    Bipolar disorder Maternal Aunt    Mental illness Paternal Aunt    Mental illness Paternal Uncle    Suicidality Paternal Uncle    Seizures Maternal Grandmother    Breast cancer Other 67   Ovarian cancer Other 66   Suicidality Maternal Great-grandmother    Family Psychiatric  History: see above Social History:  Social History   Substance and Sexual Activity  Alcohol Use Not Currently   Comment: RARE     Social History   Substance and Sexual Activity  Drug Use Yes   Types: Marijuana  Social History   Socioeconomic History   Marital status: Married    Spouse name: Anna Woodard   Number of children: 2   Years of education: Not on file   Highest education level: Associate degree: occupational, Hotel manager, or vocational program  Occupational History   Not on file  Tobacco Use   Smoking status: Former    Packs/day: 0.50    Years: 15.00    Total pack years: 7.50    Types: Cigarettes    Quit date: 04/23/2021    Years since quitting: 0.6   Smokeless tobacco: Never  Vaping Use   Vaping Use: Former  Substance and Sexual Activity   Alcohol use: Not Currently    Comment: RARE   Drug use: Yes    Types: Marijuana   Sexual activity:  Yes    Birth control/protection: Surgical    Comment: vasectomy  Other Topics Concern   Not on file  Social History Narrative   Not on file   Social Determinants of Health   Financial Resource Strain: Not on file  Food Insecurity: Not on file  Transportation Needs: Not on file  Physical Activity: Not on file  Stress: Not on file  Social Connections: Not on file   Additional Social History:  lives with her husband and two children    Allergies:   Allergies  Allergen Reactions   Shellfish Allergy Anaphylaxis, Swelling and Other (See Comments)    Eyes swelling, thickening tongue   Zithromax [Azithromycin] Anaphylaxis   Molds & Smuts    Naproxen    Other     See List scanned in to Media.   Penicillins     Labs:  Results for orders placed or performed during the hospital encounter of 01/02/22 (from the past 48 hour(s))  Comprehensive metabolic panel     Status: Abnormal   Collection Time: 01/02/22 11:14 AM  Result Value Ref Range   Sodium 140 135 - 145 mmol/L   Potassium 3.3 (L) 3.5 - 5.1 mmol/L   Chloride 106 98 - 111 mmol/L   CO2 24 22 - 32 mmol/L   Glucose, Bld 121 (H) 70 - 99 mg/dL    Comment: Glucose reference range applies only to samples taken after fasting for at least 8 hours.   BUN 7 6 - 20 mg/dL   Creatinine, Ser 0.71 0.44 - 1.00 mg/dL   Calcium 9.5 8.9 - 10.3 mg/dL   Total Protein 7.5 6.5 - 8.1 g/dL   Albumin 4.6 3.5 - 5.0 g/dL   AST 25 15 - 41 U/L   ALT 25 0 - 44 U/L   Alkaline Phosphatase 73 38 - 126 U/L   Total Bilirubin 1.1 0.3 - 1.2 mg/dL   GFR, Estimated >60 >60 mL/min    Comment: (NOTE) Calculated using the CKD-EPI Creatinine Equation (2021)    Anion gap 10 5 - 15    Comment: Performed at [facility], [address] [place], [place] [address]  Ethanol     Status: None   Collection Time: 01/02/22 11:14 AM  Result Value Ref Range   Alcohol, Ethyl (B) <10 <10 mg/dL    Comment: (NOTE) Lowest detectable limit for serum alcohol is 10  mg/dL.  For medical purposes only. Performed at [facility], [place], [place] [address]   Salicylate level     Status: Abnormal   Collection Time: 01/02/22 11:14 AM  Result Value Ref Range   Salicylate Lvl <5.7 (L) 7.0 - 30.0  mg/dL    Comment: Performed at [facility], [place], [place] [address]  Acetaminophen level     Status: Abnormal   Collection Time: 01/02/22 11:14 AM  Result Value Ref Range   Acetaminophen (Tylenol), Serum <10 (L) 10 - 30 ug/mL    Comment: (NOTE) Therapeutic concentrations vary significantly. A range of 10-30 ug/mL  may be an effective concentration for many patients. However, some  are best treated at concentrations outside of this range. Acetaminophen concentrations >150 ug/mL at 4 hours after ingestion  and >50 ug/mL at 12 hours after ingestion are often associated with  toxic reactions.  Performed at [facility], [address] [place], [place] [address]   cbc     Status: None   Collection Time: 01/02/22 11:14 AM  Result Value Ref Range   WBC 5.9 4.0 - 10.5 K/uL   RBC 4.80 3.87 - 5.11 MIL/uL   Hemoglobin 14.6 12.0 - 15.0 g/dL   HCT 42.2 36.0 - 46.0 %   MCV 87.9 80.0 - 100.0 fL   MCH 30.4 26.0 - 34.0 pg   MCHC 34.6 30.0 - 36.0 g/dL   RDW 12.6 11.5 - 15.5 %   Platelets 354 150 - 400 K/uL   nRBC 0.0 0.0 - 0.2 %    Comment: Performed at [facility], [address] [place], [place] [address]  Urine Drug Screen, Qualitative     Status: Abnormal   Collection Time: 01/02/22 11:32 AM  Result Value Ref Range   Tricyclic, Ur Screen NONE DETECTED NONE DETECTED   Amphetamines, Ur Screen NONE DETECTED NONE DETECTED   MDMA (Ecstasy)Ur Screen NONE DETECTED NONE DETECTED   Cocaine Metabolite,Ur [place] NONE DETECTED NONE DETECTED   Opiate, Ur Screen NONE DETECTED NONE DETECTED   Phencyclidine (PCP) Ur S NONE DETECTED NONE DETECTED   Cannabinoid 50 Ng, Ur [place] POSITIVE (A) NONE DETECTED    Barbiturates, Ur Screen NONE DETECTED NONE DETECTED   Benzodiazepine, Ur Scrn NONE DETECTED NONE DETECTED   Methadone Scn, Ur NONE DETECTED NONE DETECTED    Comment: (NOTE) Tricyclics + metabolites, urine    Cutoff 1000 ng/mL Amphetamines + metabolites, urine  Cutoff 1000 ng/mL MDMA (Ecstasy), urine              Cutoff 500 ng/mL Cocaine Metabolite, urine          Cutoff 300 ng/mL Opiate + metabolites, urine        Cutoff 300 ng/mL Phencyclidine (PCP), urine         Cutoff 25 ng/mL Cannabinoid, urine                 Cutoff 50 ng/mL Barbiturates + metabolites, urine  Cutoff 200 ng/mL Benzodiazepine, urine              Cutoff 200 ng/mL Methadone, urine                   Cutoff 300 ng/mL  The urine drug screen provides only a preliminary, unconfirmed analytical test result and should not be used for non-medical purposes. Clinical consideration and professional judgment should be applied to any positive drug screen result due to possible interfering substances. A more specific alternate chemical method must be used in order to obtain a confirmed analytical result. Gas chromatography / mass spectrometry (GC/MS) is the preferred confirm atory method. Performed at [facility], [address] [place], [place] [address]   POC urine preg, ED  Status: None   Collection Time: 01/02/22 11:34 AM  Result Value Ref Range   Preg Test, Ur NEGATIVE NEGATIVE    Comment:        THE SENSITIVITY OF THIS METHODOLOGY IS >24 mIU/mL     Current Facility-Administered Medications  Medication Dose Route Frequency Provider Last Rate Last Admin   hydrOXYzine (ATARAX) tablet 25 mg  25 mg Oral Q6H PRN Patrecia Pour, NP       potassium chloride SA (KLOR-CON M) CR tablet 40 mEq  40 mEq Oral Once Vanessa [place], MD       Current Outpatient Medications  Medication Sig Dispense Refill   acetaminophen (TYLENOL) 500 MG tablet Take 1,000 mg by mouth every 6 (six) hours as needed for moderate pain or  headache.     ADVAIR DISKUS 250-50 MCG/DOSE AEPB Inhale 1 puff into the lungs 2 (two) times daily.     albuterol (VENTOLIN HFA) 108 (90 Base) MCG/ACT inhaler ProAir HFA 90 mcg/actuation aerosol inhaler  INHALE 1 TO 2 PUFFS BY MOUTH EVERY 4 TO 6 HOURS AS NEEDED FOR COUGH OR WHEEZING     EPINEPHrine 0.3 mg/0.3 mL IJ SOAJ injection Inject 0.3 mg into the muscle as needed.     famotidine (PEPCID) 40 MG tablet Take 1 tablet by mouth daily. (Patient not taking: Reported on 09/15/2021)     hydrOXYzine (VISTARIL) 25 MG capsule TAKE 1 CAPSULE(25 MG) BY MOUTH TWICE DAILY AS NEEDED FOR ANXIETY OR SEVERE ANXIETY 180 capsule 0   levocetirizine (XYZAL) 5 MG tablet Take 1 tablet by mouth daily.     montelukast (SINGULAIR) 10 MG tablet Take 10 mg by mouth daily.     PARoxetine (PAXIL) 20 MG tablet TAKE 1 TABLET(20 MG) BY MOUTH DAILY WITH BREAKFAST 90 tablet 0   prazosin (MINIPRESS) 1 MG capsule TAKE 1 CAPSULE(1 MG) BY MOUTH AT BEDTIME FOR NIGHTMARES 90 capsule 0   predniSONE (STERAPRED UNI-PAK 21 TAB) 10 MG (21) TBPK tablet Take 6 tabs on day 1; take 5 tabs day 2; take 4 tabs day 3; take 3 tabs day 4; take 2 tabs day 5; take 1 tab day 6 21 tablet 0   QUEtiapine (SEROQUEL) 100 MG tablet TAKE 1 TABLET(100 MG) BY MOUTH AT BEDTIME 90 tablet 0    Musculoskeletal: Strength & Muscle Tone: within normal limits Gait & Station: normal Patient leans: N/A  Psychiatric Specialty Exam: Physical Exam Vitals and nursing note reviewed.  Constitutional:      Appearance: Normal appearance.  HENT:     Head: Normocephalic.     Nose: Nose normal.  Pulmonary:     Effort: Pulmonary effort is normal.  Musculoskeletal:        General: Normal range of motion.     Cervical back: Normal range of motion.  Neurological:     General: No focal deficit present.     Mental Status: She is alert and oriented to person, place, and time.  Psychiatric:        Attention and Perception: Attention and perception normal.        Mood and  Affect: Mood is anxious and depressed.        Speech: Speech normal.        Behavior: Behavior is cooperative.        Thought Content: Thought content normal.        Cognition and Memory: Cognition and memory normal.        Judgment: Judgment normal.  Review of Systems  Psychiatric/Behavioral:  Positive for depression. The patient is nervous/anxious.   All other systems reviewed and are negative.   Blood pressure (!) 152/103, pulse (!) 114, temperature 98.1 F (36.7 C), temperature source Oral, resp. rate 20, height '5\' 6"'$  (1.676 m), weight 73 kg, last menstrual period 12/21/2021, SpO2 98 %, not currently breastfeeding.Body mass index is 25.99 kg/m.  General Appearance: Casual  Eye Contact:  Good  Speech:  Normal Rate  Volume:  Normal  Mood:  Anxious and Depressed  Affect:  Congruent  Thought Process:  Coherent and Descriptions of Associations: Intact  Orientation:  Full (Time, Place, and Person)  Thought Content:  Rumination  Suicidal Thoughts:  No  Homicidal Thoughts:  No  Memory:  Immediate;   Fair Recent;   Fair Remote;   Fair  Judgement:  Fair  Insight:  Fair  Psychomotor Activity:  Normal  Concentration:  Concentration: Good and Attention Span: Good  Recall:  Good  Fund of Knowledge:  Good  Language:  Good  Akathisia:  No  Handed:  Right  AIMS (if indicated):     Assets:  Housing Intimacy Leisure Time Physical Health Resilience Social Support  ADL's:  Intact  Cognition:  WNL  Sleep:        Physical Exam: Physical Exam Vitals and nursing note reviewed.  Constitutional:      Appearance: Normal appearance.  HENT:     Head: Normocephalic.     Nose: Nose normal.  Pulmonary:     Effort: Pulmonary effort is normal.  Musculoskeletal:        General: Normal range of motion.     Cervical back: Normal range of motion.  Neurological:     General: No focal deficit present.     Mental Status: She is alert and oriented to person, place, and time.   Psychiatric:        Attention and Perception: Attention and perception normal.        Mood and Affect: Mood is anxious and depressed.        Speech: Speech normal.        Behavior: Behavior is cooperative.        Thought Content: Thought content normal.        Cognition and Memory: Cognition and memory normal.        Judgment: Judgment normal.    Review of Systems  Psychiatric/Behavioral:  Positive for depression. The patient is nervous/anxious.   All other systems reviewed and are negative.  Blood pressure (!) 152/103, pulse (!) 114, temperature 98.1 F (36.7 C), temperature source Oral, resp. rate 20, height '5\' 6"'$  (1.676 m), weight 73 kg, last menstrual period 12/21/2021, SpO2 98 %, not currently breastfeeding. Body mass index is 25.99 kg/m.  Treatment Plan Summary: Bipolar disorder, mixed, moderate: Follow up with Dr Shea Evans on Monday, return to the ED if feeling unsafe or symptoms get worse No medication changes.  Disposition: No evidence of imminent risk to self or others at present.   Patient does not meet criteria for psychiatric inpatient admission. Supportive therapy provided about ongoing stressors.  Waylan B[place], NP 01/02/2022 2:53 PM

## 2022-01-02 NOTE — Progress Notes (Signed)
Virtual Visit Consent   Anna Woodard, you are scheduled for a virtual visit with a [facility] provider today. Just as with appointments in the office, your consent must be obtained to participate. Your consent will be active for this visit and any virtual visit you may have with one of our providers in the next 365 days. If you have a MyChart account, a copy of this consent can be sent to you electronically.  As this is a virtual visit, video technology does not allow for your provider to perform a traditional examination. This may limit your provider's ability to fully assess your condition. If your provider identifies any concerns that need to be evaluated in person or the need to arrange testing (such as labs, EKG, etc.), we will make arrangements to do so. Although advances in technology are sophisticated, we cannot ensure that it will always work on either your end or our end. If the connection with a video visit is poor, the visit may have to be switched to a telephone visit. With either a video or telephone visit, we are not always able to ensure that we have a secure connection.  By engaging in this virtual visit, you consent to the provision of healthcare and authorize for your insurance to be billed (if applicable) for the services provided during this visit. Depending on your insurance coverage, you may receive a charge related to this service.  I need to obtain your verbal consent now. Are you willing to proceed with your visit today? Yena Tisby has provided verbal consent on 01/02/2022 for a virtual visit (video or telephone). Gildardo Pounds, NP  Date: 01/02/2022 10:43 AM  Virtual Visit via Video Note   I, Gildardo Pounds, connected with  [name]  ([id number], 1990/10/31) on 01/02/22 at 10:45 AM EDT by a video-enabled telemedicine application and verified that I am speaking with the correct person using two identifiers.  Location: Patient: Virtual Visit Location Patient:  Home Provider: Virtual Visit Location Provider: Home Office   I discussed the limitations of evaluation and management by telemedicine and the availability of in person appointments. The patient expressed understanding and agreed to proceed.    History of Present Illness: Anna Woodard is a 31 y.o. who identifies as a female who was assigned female at birth, and is being seen today for [place].   Very tearful. States she has been having a nervous breakdown for the past few weeks. Missed her therapist and psychiatry appt.  She has a history of PTSD, panic disorder, Bipolar affective disorder, substance abuse/use disorder.  States "I feel like I want to pull the bones out of my body or break both of my elbows off".  Wants to know if I can prescribe her additional medication as an adjunct to her prazosin, hydroxyzine (she states she is already doubling this dosage), paxil and seroquel. I offered to increase her paxil however I also instructed her this would not relieve her current symptoms immediately due to how SSRIs work.  At this time I have recommended she go to the Emergency room for evaluation and if she can not take herself or have someone take her she needs to call 911. I also recommended she go to Behavioral health for assessment for possible IP admission. She immediately states "Oh I'm not doing that. 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   History of methylenedioxymethamphetamine (MDMA) use  03/04/2021   At risk for prolonged QT interval syndrome 03/04/2021   High risk medication use 03/04/2021   Tobacco use disorder 03/04/2021   Anxiety and depression    Single liveborn infant delivered vaginally 03/18/2020   6 weeks postpartum follow-up 03/18/2020   Encounter for care or examination of lactating mother 03/18/2020   Encounter for elective induction of labor 03/17/2020   Labor and delivery, indication for care 02/18/2020   [redacted] weeks gestation of pregnancy 02/18/2020   Choroid plexus cyst, fetal, affecting care of mother, antepartum, single gestation 10/25/2019   Supervision of high risk pregnancy, antepartum 08/14/2019   History of loop electrosurgical excision procedure (LEEP) of cervix affecting pregnancy, antepartum 08/14/2019   Bartholin's gland abscess 09/09/2017    Allergies:  Allergies  Allergen Reactions   Shellfish Allergy Anaphylaxis, Swelling and Other (See Comments)    Eyes swelling, thickening tongue   Zithromax [Azithromycin] Anaphylaxis   Molds & Smuts    Naproxen    Other     See List scanned in to Media.   Penicillins    Medications:  Current Outpatient Medications:    acetaminophen (TYLENOL) 500 MG tablet, Take 1,000 mg by mouth every 6 (six) hours as needed for moderate pain or headache., Disp: , Rfl:    ADVAIR DISKUS 250-50 MCG/DOSE AEPB, Inhale 1 puff into the lungs 2 (two) times daily., Disp: , Rfl:    albuterol (VENTOLIN HFA) 108 (90 Base) MCG/ACT inhaler, ProAir HFA 90 mcg/actuation aerosol inhaler  INHALE 1 TO 2 PUFFS BY MOUTH EVERY 4 TO 6 HOURS AS NEEDED FOR COUGH OR WHEEZING, Disp: , Rfl:    EPINEPHrine 0.3 mg/0.3 mL IJ SOAJ injection, Inject 0.3 mg into the muscle as needed., Disp: , Rfl:    famotidine (PEPCID) 40 MG tablet, Take 1 tablet by mouth daily. (Patient not taking: Reported on 09/15/2021), Disp: , Rfl:    hydrOXYzine (VISTARIL) 25 MG capsule, TAKE 1 CAPSULE(25 MG) BY MOUTH TWICE DAILY AS NEEDED FOR ANXIETY OR SEVERE ANXIETY, Disp: 180  capsule, Rfl: 0   levocetirizine (XYZAL) 5 MG tablet, Take 1 tablet by mouth daily., Disp: , Rfl:    montelukast (SINGULAIR) 10 MG tablet, Take 10 mg by mouth daily., Disp: , Rfl:    PARoxetine (PAXIL) 20 MG tablet, TAKE 1 TABLET(20 MG) BY MOUTH DAILY WITH BREAKFAST, Disp: 90 tablet, Rfl: 0   prazosin (MINIPRESS) 1 MG capsule, TAKE 1 CAPSULE(1 MG) BY MOUTH AT BEDTIME FOR NIGHTMARES, Disp: 90 capsule, Rfl: 0   predniSONE (STERAPRED UNI-PAK 21 TAB) 10 MG (21) TBPK tablet, Take 6 tabs on day 1; take 5 tabs day 2; take 4 tabs day 3; take 3 tabs day 4; take 2 tabs day 5; take 1 tab day 6, Disp: 21 tablet, Rfl: 0   QUEtiapine (SEROQUEL) 100 MG tablet, TAKE 1 TABLET(100 MG) BY MOUTH AT BEDTIME, Disp: 90 tablet, Rfl: 0  Observations/Objective: Patient is well-developed, well-nourished, anxious and tearful Head is normocephalic, atraumatic.  No labored breathing.  Speech is clear and coherent with logical content.  Patient is alert and oriented at baseline.  Assessment and Plan: 1. PTSD (post-traumatic stress disorder) 2. Panic disorder CALL 911 or go to the nearest emergency room    Follow Up Instructions: I discussed the assessment and treatment plan with the patient. The patient was provided an opportunity to ask questions and all were answered. The patient agreed with the plan and demonstrated an understanding of the instructions.  A copy of instructions were sent to the patient via MyChart unless otherwise noted below.     The patient was advised to call back or seek an in-person evaluation if the symptoms worsen or if the condition fails to improve as anticipated.  Time:  I spent 11 minutes with the patient via telehealth technology discussing the above problems/concerns.    Gildardo Pounds, NP

## 2022-01-09 ENCOUNTER — Other Ambulatory Visit: Payer: Self-pay | Admitting: Psychiatry

## 2022-01-09 DIAGNOSIS — F4310 Post-traumatic stress disorder, unspecified: Secondary | ICD-10-CM

## 2022-01-09 DIAGNOSIS — F319 Bipolar disorder, unspecified: Secondary | ICD-10-CM

## 2022-01-09 DIAGNOSIS — F410 Panic disorder [episodic paroxysmal anxiety] without agoraphobia: Secondary | ICD-10-CM

## 2022-01-13 ENCOUNTER — Telehealth (HOSPITAL_COMMUNITY): Payer: Self-pay | Admitting: Professional

## 2022-01-13 ENCOUNTER — Ambulatory Visit (INDEPENDENT_AMBULATORY_CARE_PROVIDER_SITE_OTHER): Payer: 59 | Admitting: Psychiatry

## 2022-01-13 ENCOUNTER — Encounter: Payer: Self-pay | Admitting: Psychiatry

## 2022-01-13 VITALS — BP 119/79 | HR 70 | Temp 98.7°F | Wt 153.4 lb

## 2022-01-13 DIAGNOSIS — F1021 Alcohol dependence, in remission: Secondary | ICD-10-CM | POA: Diagnosis not present

## 2022-01-13 DIAGNOSIS — F410 Panic disorder [episodic paroxysmal anxiety] without agoraphobia: Secondary | ICD-10-CM

## 2022-01-13 DIAGNOSIS — F1691 Hallucinogen use, unspecified, in remission: Secondary | ICD-10-CM

## 2022-01-13 DIAGNOSIS — F4310 Post-traumatic stress disorder, unspecified: Secondary | ICD-10-CM | POA: Diagnosis not present

## 2022-01-13 DIAGNOSIS — J309 Allergic rhinitis, unspecified: Secondary | ICD-10-CM | POA: Insufficient documentation

## 2022-01-13 DIAGNOSIS — Z9189 Other specified personal risk factors, not elsewhere classified: Secondary | ICD-10-CM

## 2022-01-13 DIAGNOSIS — F1994 Other psychoactive substance use, unspecified with psychoactive substance-induced mood disorder: Secondary | ICD-10-CM | POA: Diagnosis not present

## 2022-01-13 DIAGNOSIS — F1611 Hallucinogen abuse, in remission: Secondary | ICD-10-CM

## 2022-01-13 DIAGNOSIS — F1210 Cannabis abuse, uncomplicated: Secondary | ICD-10-CM

## 2022-01-13 DIAGNOSIS — Z882 Allergy status to sulfonamides status: Secondary | ICD-10-CM | POA: Insufficient documentation

## 2022-01-13 DIAGNOSIS — L501 Idiopathic urticaria: Secondary | ICD-10-CM | POA: Insufficient documentation

## 2022-01-13 DIAGNOSIS — J45990 Exercise induced bronchospasm: Secondary | ICD-10-CM | POA: Insufficient documentation

## 2022-01-13 DIAGNOSIS — Z91013 Allergy to seafood: Secondary | ICD-10-CM | POA: Insufficient documentation

## 2022-01-13 MED ORDER — QUETIAPINE FUMARATE 100 MG PO TABS: 150.0000 mg | ORAL_TABLET | Freq: Every day | ORAL | 1 refills | Status: AC

## 2022-01-13 MED ORDER — PRAZOSIN HCL 1 MG PO CAPS: ORAL_CAPSULE | ORAL | 0 refills | Status: AC

## 2022-01-13 MED ORDER — PAROXETINE HCL 20 MG PO TABS: ORAL_TABLET | ORAL | 0 refills | Status: AC

## 2022-01-13 NOTE — Patient Instructions (Addendum)
Please call for EKG - [phone]     Cannabis Use Disorder Cannabis use disorder occurs when marijuana use disrupts a person's daily life or causes health problems. This condition can be dangerous. The health problems this condition can cause include: Long-lasting problems with thinking and learning. These can be permanent in young people. Mental health problems, such as severe anxiety, paranoia, hallucinations, or schizophrenia. Dangerously high blood pressure and heart rate. Breathing problems. Problems with child development during and after pregnancy. People with this condition are also more likely to use other drugs. What are the causes? This condition is caused by using marijuana too much over time. It is not caused by using it only once in a while. Many people with this condition use marijuana because it gives them a feeling of extreme pleasure or relaxation. What increases the risk? This condition is more likely to develop in: Men. People with a family history of cannabis use disorder. People with mental health issues such as depression or post-traumatic stress disorder (PTSD). What are the signs or symptoms? Symptoms of this condition include: Addiction Using greater amounts of marijuana than you want to, or using marijuana for longer than you want to. Craving marijuana. Spending a lot of time getting marijuana, using it, or recovering from its effects. Having problems at work, at school, at home, or in relationships because of marijuana use. Giving up or cutting down on important life activities because of marijuana use. Using marijuana at times when it is dangerous, such as while you are driving a car. Needing more and more marijuana to get the same desired effect (building up a tolerance). Lack of motivation, known as amotivational syndrome, which leads to poor school and work performance. Physical problems A long-lasting cough. Bronchitis. Emphysema. Throat and lung  cancer. Mental problems Psychosis. Anxiety. Trouble sleeping. Increase in violent behavior in young people. Withdrawal problems You may have symptoms when you stop using marijuana. Symptoms include: Irritability or anger. Anxiety or restlessness. Trouble sleeping. Loss of appetite or weight loss. Aches and pains. Shakiness, sweating, or chills. How is this diagnosed? This condition is diagnosed with an assessment. During the assessment, your health care provider will ask about your marijuana use and how it affects your life. You will be diagnosed with the condition if you have had at least two symptoms of this condition within a 35-monthperiod. How severe the condition is depends on how many symptoms you have. If you have two to three symptoms, your condition is mild. If you have four to five symptoms, your condition is moderate. If you have six or more symptoms, your condition is severe. Your health care provider may perform a physical exam or do lab tests to see if you have physical problems resulting from marijuana use. Your health care provider may also screen for drug use and refer you to a mental health professional for evaluation. How is this treated? Treatment for this condition is usually provided by mental health professionals with training in substance use disorders. Your treatment may involve: Counseling. This treatment is also called talk therapy. It is provided by substance use treatment counselors. A counselor can address the reasons you use marijuana and suggest ways to keep you from using it again. The goals of talk therapy are to: Find healthy activities to replace using marijuana. Identify and avoid the things that trigger your marijuana use. Help you learn how to handle cravings. Support groups. Support groups are led by people who have quit using marijuana.  They provide emotional support, advice, and guidance. Medicine. Medicine is used to treat mental health issues  that trigger marijuana use or that result from it. Follow these instructions at home: Lifestyle Make healthy lifestyle choices, such as: Eating a healthy diet. Getting enough exercise. Improving your stress-management skills.  General instructions Take over-the-counter, prescription medicines, and herbal remedies only as told by your health care provider. Check with your health care provider before starting any new medicines. Work with Higher education careers adviser or group to develop tools to keep you from using marijuana again (relapsing). Learn daily living skills and work Psychologist, occupational. Keep all follow-up visits as told by your health care provider. This is important. Where to find more information [employer] on Drug Abuse: motorcyclefax.com Centers for Disease Control and Prevention: http://www.wolf.info/ Substance Abuse and Mental Health Services Administration: ktimeonline.com Contact a health care provider if: You are not able to take your medicines as told. Your symptoms get worse. Get help right away if: You have serious thoughts about hurting yourself or others. If you ever feel like you may hurt yourself or others, or have thoughts about taking your own life, get help right away. You can go to your nearest emergency department or call: Your local emergency services (911 in the [place] A suicide crisis helpline, such as the [facility] at 2177877912 or 988 in the [place]. This is open 24 hours a day in the [place] Summary Cannabis use disorder is when using marijuana disrupts a person's daily life or causes health problems. This condition is caused by using marijuana too much over time. Treatment for this condition is usually provided by mental health professionals with training in substance use disorders. Treatment may involve counseling, support groups, or medicine. This information is not intended to replace advice given to you by your health care provider. Make sure you  discuss any questions you have with your health care provider. Document Revised: 03/20/2021 Document Reviewed: 03/27/2019 Elsevier Patient Education  [place].

## 2022-01-13 NOTE — Progress Notes (Signed)
[facility] OP Progress Note  01/13/2022 3:01 PM Anna Woodard  MRN:  [id number]  Chief Complaint:  Chief Complaint  Patient presents with   Follow-up: 31 year old Caucasian female with history of PTSD, bipolar disorder, panic disorder, alcohol use disorder, cannabis use disorder, with recent visit to the emergency department for worsening mood and possible catatonic features, presented for medication management.   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  Patient's last visit was on 09/15/2021.  At that visit patient was improving and was advised to follow-up in 3 months.  Patient however missed an appointment in early August.  Patient later on went to the emergency department on 01/02/2022.  Reviewed notes per Ms. Magda Bernheim reported panic attacks, then catatonia, and her definition of catatonia was staying in bed and self isolating at times.  Patient also reported practicing yoga as she feels her bones are needing to bend or trying to leave her body.  Patient denied any suicidal ideation.  Patient's spouse reported no safety concern when he was contacted.  Patient was released from the emergency department with recommendation to follow up with outpatient provider."  Patient today is tearful in session.  Reports she has been having depressive symptoms, mood swings, crying spells, getting worse since the past several weeks.  Patient reports she has no idea how she had the episode that happened late August when she was taken to the emergency department.  Patient reports she stayed in bed for 4 days and would only wake up to drink water.  She reports she is  currently out of it although she continues to struggle with her mood.  Patient reports she is also struggling with sleep.  She does not have a good sleep hygiene.  She goes to bed late and stays in bed till noon.  She hence feels she does not have a life since she is staying in bed a lot.  She is currently compliant on the Seroquel, Minipress.  Does have a lot of nightmares from her past history of trauma.  Minipress used to help in the past however not anymore.  Patient reports she has started writing her memoire and that may have triggered her current depression and trauma related symptoms.  She has not been compliant with psychotherapy sessions however would like to get back in therapy with Ms. Christina Hussami.  Agrees to schedule an appointment.  Patient also agreeable to referral for PHP.  Patient continues to smoke cannabis, reports she had slowed down however when she started experiencing sleep problem she started using it more and currently uses it regularly at night.  Unknown if cannabis likely induced the episode last month.  Patient provided education.  Currently denies any suicidality, homicidality or perceptual disturbances.  Continues to struggle with concentration deficit, had an appointment with Dr. Sima Matas for testing however reports she did not feel comfortable since he was a " Female " and did not go back.  Patient is interested in getting tested with another provider if possible.    Visit Diagnosis:    ICD-10-CM   1. PTSD (post-traumatic stress disorder)  F43.10 QUEtiapine (SEROQUEL) 100 MG tablet    EKG 12-Lead    prazosin (MINIPRESS) 1 MG  capsule    PARoxetine (PAXIL) 20 MG tablet    2. 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 Panic disorder  F41.0 QUEtiapine (SEROQUEL) 100 MG tablet    EKG 12-Lead    PARoxetine (PAXIL) 20 MG tablet    4. Alcohol use disorder,  moderate, in early remission ([place])  F10.21     5. Cannabis use disorder, mild, abuse  F12.10     6. 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 At risk for prolonged QT interval syndrome  Z91.89 EKG 12-Lead      Past Psychiatric History: Reviewed past psychiatric history from progress note on 03/04/2021.  Past trials of medications like Wellbutrin, Xanax, Valium, Prozac.  Past Medical History:  Past Medical History:  Diagnosis Date   Anxiety and depression    Cancer ([place])    CERBVICAL   CIN III (cervical intraepithelial neoplasia grade III) with severe dysplasia    Complication of anesthesia    WAKES UP IN PANIC / CRYING   Depression    HPV (human papilloma virus) infection    Panic disorder    Panic disorder    PTSD (post-traumatic stress disorder)     Past Surgical History:  Procedure Laterality Date   BARTHOLIN CYST MARSUPIALIZATION N/A 09/09/2017   Procedure: BARTHOLIN CYST MARSUPIALIZATION;  Surgeon: Will Bonnet, MD;  Location: [facility] ORS;  Service: Gynecology;  Laterality: N/A;   CERVICAL BIOPSY  W/ LOOP ELECTRODE EXCISION     DENTAL SURGERY      Family Psychiatric History: Reviewed family psychiatric history from progress note on 03/04/2021.  Family History:  Family History  Problem Relation Age of Onset   Drug abuse Mother    Alcohol abuse Mother    Colon cancer Mother 30   Hypertension Mother    Stroke Mother    Alcohol abuse Father    Mental illness Father    Bipolar disorder Maternal Aunt    Mental illness Paternal Aunt    Mental illness Paternal Uncle    Suicidality Paternal Uncle    Seizures Maternal Grandmother    Breast cancer Other 63   Ovarian cancer Other 7   Suicidality Maternal Great-grandmother     Social History: Reviewed social history from progress note on 03/04/2021. Social History   Socioeconomic History   Marital status: Married    Spouse name: [name]    Number of children: 2   Years of education: Not on file   Highest education level: Associate degree: occupational, Hotel manager, or vocational program  Occupational History   Not on file  Tobacco Use   Smoking status: Former    Packs/day: 0.50    Years: 15.00    Total pack years: 7.50    Types: Cigarettes    Quit date: 04/23/2021    Years since quitting: 0.7   Smokeless tobacco: Never  Vaping Use   Vaping Use: Former  Substance and Sexual Activity   Alcohol use: Not Currently    Comment: RARE   Drug use: Yes    Types: Marijuana   Sexual activity: Yes    Birth control/protection: Surgical    Comment: vasectomy  Other Topics Concern   Not on file  Social History Narrative   Not on file   Social Determinants of Health  Financial Resource Strain: Not on file  Food Insecurity: Not on file  Transportation Needs: Not on file  Physical Activity: Not on file  Stress: Not on file  Social Connections: Not on file    Allergies:  Allergies  Allergen Reactions   Shellfish Allergy Anaphylaxis, Swelling and Other (See Comments)    Eyes swelling, thickening tongue   Zithromax [Azithromycin] Anaphylaxis   Molds & Smuts    Naproxen    Other     See List scanned in to Media.   Penicillins     Metabolic Disorder Labs: Lab Results  Component Value Date   HGBA1C 5.1 03/04/2021   MPG 100 03/04/2021   Lab Results  Component Value Date   PROLACTIN 5.1 03/04/2021   Lab Results  Component Value Date   CHOL 127 03/04/2021   TRIG 33 03/04/2021   HDL 46 03/04/2021   CHOLHDL 2.8 03/04/2021   VLDL 7 03/04/2021   LDLCALC 74 03/04/2021   Lab Results  Component Value Date   TSH 0.508 03/04/2021    Therapeutic Level Labs: No results found for: "LITHIUM" No results found for: "VALPROATE" No results found for: "CBMZ"  Current Medications: Current Outpatient Medications  Medication Sig Dispense Refill   ADVAIR DISKUS 250-50 MCG/DOSE AEPB Inhale 1 puff into the lungs 2  (two) times daily.     EPINEPHrine 0.3 mg/0.3 mL IJ SOAJ injection Inject 0.3 mg into the muscle as needed.     famotidine (PEPCID) 40 MG tablet Take 1 tablet by mouth daily.     hydrOXYzine (VISTARIL) 25 MG capsule TAKE 1 CAPSULE(25 MG) BY MOUTH TWICE DAILY AS NEEDED FOR ANXIETY OR SEVERE ANXIETY 180 capsule 0   QUEtiapine (SEROQUEL) 100 MG tablet Take 1.5 tablets (150 mg total) by mouth at bedtime. Dose change 45 tablet 1   PARoxetine (PAXIL) 20 MG tablet TAKE 1 TABLET(20 MG) BY MOUTH DAILY WITH BREAKFAST 90 tablet 0   prazosin (MINIPRESS) 1 MG capsule TAKE 1 CAPSULE(1 MG) BY MOUTH AT BEDTIME FOR NIGHTMARES 90 capsule 0   No current facility-administered medications for this visit.     Musculoskeletal: Strength & Muscle Tone: within normal limits Gait & Station: normal Patient leans: N/A  Psychiatric Specialty Exam: Review of Systems  Psychiatric/Behavioral:  Positive for decreased concentration, dysphoric mood and sleep disturbance. The patient is nervous/anxious.   All other systems reviewed and are negative.   Blood pressure 119/79, pulse 70, temperature 98.7 F (37.1 C), temperature source Temporal, weight 153 lb 6.4 oz (69.6 kg), last menstrual period 12/21/2021, not currently breastfeeding.Body mass index is 24.76 kg/m.  General Appearance: Casual  Eye Contact:  Fair  Speech:  Clear and Coherent  Volume:  Normal  Mood:  Anxious and Depressed  Affect:  Tearful  Thought Process:  Goal Directed and Descriptions of Associations: Intact  Orientation:  Full (Time, Place, and Person)  Thought Content: Rumination   Suicidal Thoughts:  No  Homicidal Thoughts:  No  Memory:  Immediate;   Fair Recent;   Fair Remote;   Fair  Judgement:  Fair  Insight:  Fair  Psychomotor Activity:  Normal  Concentration:  Concentration: Fair and Attention Span: Fair  Recall:  [employer] of Knowledge: Fair  Language: Fair  Akathisia:  No  Handed:  Right  AIMS (if indicated): done  Assets:   Communication Skills Desire for Improvement Housing Intimacy Social Support  ADL's:  Intact  Cognition: WNL  Sleep:  Poor   Screenings: AIMS  [place] Visit from 01/13/2022 in [facility] Video Visit from 09/15/2021 in [place] Total Score 0 0      AUDIT    [place] Office Visit from 03/04/2021 in [place]  Alcohol Use Disorder Identification Test Final Score (AUDIT) 22      GAD-7    [place] Office Visit from 01/13/2022 in [place] Video Visit from 09/15/2021 in [place] Office Visit from 04/28/2021 in [facility] Office Visit from 03/04/2021 in [facility]  Total GAD-7 Score '18 1 14 21      '$ PHQ2-9    [place] Visit from 01/13/2022 in [facility] Video Visit from 09/15/2021 in [place] Video Visit from 06/12/2021 in [facility] from 06/10/2021 in [facility] Office Visit from 04/28/2021 in [facility]  PHQ-2 Total Score 6 0 '2 1 1  '$ PHQ-9 Total Score '22 1 8 10 7      '$ [place] Office Visit from 01/13/2022 in [facility] from 01/02/2022 in [facility] Video Visit from 09/15/2021 in [place] No Risk Error: Q3, 4, or 5 should not be populated when Q2 is No No Risk        Assessment and Plan: Lourdez Mcgahan is a 31 year old Caucasian female who has a history of PTSD, bipolar and related disorder likely substance-induced, cannabis use ,alcohol use disorder in remission, history of hallucinogen abuse, history of MDMA abuse, tobacco use disorder was evaluated in  office today.  Patient has been noncompliant with follow-up appointment,had recent emergency department visit for worsening depression symptoms as well as ongoing cannabis use, will benefit from following plan.  Plan Substance-induced bipolar and related disorder -unstable Increase Seroquel to 150 mg p.o. nightly. Provided education, substance abuse counseling.  PTSD-unstable Paxil 20 mg p.o. daily Seroquel dosage has been increased as noted above. Prazosin 1 mg p.o. nightly, we will consider increasing the dosage in the future. Continue CBT with Ms. Christina Hussami -patient advised to schedule an appointment. Will refer her for partial hospitalization program since patient continues to be unstable.  Patient agreeable.  Panic disorder-improving Paxil 20 mg p.o. daily  Cannabis use disorder-unstable Likely mild. Provided counseling.  Patient advised to quit using cannabis.  Discussed drug to drug interaction with her current psychotropics.  Alcohol use disorder in early remission Will monitor closely.   At risk for prolonged QT syndrome-we will order EKG.  Provided phone #[phone].  Patient advised to get the EKG completed this week.  Patient advised to contact her health insurance plan for a list of neuropsychologist since she could not complete her testing with Dr. Sima Matas to whom she was referred for her attention and focus deficit.  I have reviewed notes per Ms. Valentina Lucks 01/02/2022-most recent emergency department visit as noted above.  Urine drug screen-reviewed-dated 01/02/2022-discussed with patient-positive for cannabis.   Follow-up in clinic in 3 to 4 weeks or sooner if needed.  In the meantime patient advised participating PHP program.  Crisis plan discussed with patient.  Provided information for behavioral health urgent care.   This note was generated in part or whole with voice recognition software. Voice recognition is usually quite accurate but there are  transcription errors that can and very often do occur. I apologize for any typographical  errors that were not detected and corrected.       Ursula Alert, MD 01/14/2022, 9:53 AM

## 2022-01-14 ENCOUNTER — Other Ambulatory Visit (HOSPITAL_COMMUNITY): Payer: 59 | Attending: Psychiatry | Admitting: Licensed Clinical Social Worker

## 2022-01-14 ENCOUNTER — Encounter (HOSPITAL_COMMUNITY): Payer: Self-pay

## 2022-01-14 ENCOUNTER — Ambulatory Visit
Admission: RE | Admit: 2022-01-14 | Discharge: 2022-01-14 | Disposition: A | Discharge status: Home / Self Care | Payer: 59 | Source: Ambulatory Visit | Attending: Psychiatry | Admitting: Psychiatry

## 2022-01-14 DIAGNOSIS — F411 Generalized anxiety disorder: Secondary | ICD-10-CM

## 2022-01-14 DIAGNOSIS — F332 Major depressive disorder, recurrent severe without psychotic features: Secondary | ICD-10-CM

## 2022-01-14 DIAGNOSIS — F4310 Post-traumatic stress disorder, unspecified: Secondary | ICD-10-CM

## 2022-01-14 NOTE — Plan of Care (Signed)
  Problem: Anxiety Disorder CCP Problem  1 Learn and Apply Coping Skills to Decrease Anxiety Symptoms   Goal: LTG: Patient will score less than 5 on the Generalized Anxiety Disorder 7 Scale (GAD-7) Outcome: Not Applicable Goal: STG: Patient will attend at least 80% of scheduled PHP sessions Outcome: Not Applicable Goal: STG: Patient will attend at least 80% of scheduled group psychotherapy sessions Outcome: Not Applicable Goal: STG: Patient will complete at least 80% of assigned homework Outcome: Not Applicable   Problem: Depression CCP Problem  1 Learn and Apply Coping Skills to Decrease Depression Symptoms   Goal: LTG: Anna Woodard WILL SCORE LESS THAN 10 ON THE PATIENT HEALTH QUESTIONNAIRE (PHQ-9) Outcome: Not Applicable Goal: STG: Anna Woodard WILL IDENTIFY AT LEAST 3 COGNITIVE PATTERNS AND BELIEFS THAT SUPPORT DEPRESSION Outcome: Not Applicable
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
## 2022-01-15 ENCOUNTER — Ambulatory Visit (HOSPITAL_COMMUNITY): Payer: 59

## 2022-01-15 ENCOUNTER — Other Ambulatory Visit (HOSPITAL_COMMUNITY): Payer: 59

## 2022-01-15 ENCOUNTER — Telehealth (HOSPITAL_COMMUNITY): Payer: Self-pay | Admitting: Licensed Clinical Social Worker

## 2022-01-18 ENCOUNTER — Other Ambulatory Visit (HOSPITAL_COMMUNITY): Payer: 59

## 2022-01-19 ENCOUNTER — Other Ambulatory Visit (HOSPITAL_COMMUNITY): Payer: 59

## 2022-01-19 ENCOUNTER — Ambulatory Visit (HOSPITAL_COMMUNITY): Payer: 59

## 2022-01-20 ENCOUNTER — Encounter (HOSPITAL_COMMUNITY): Payer: Self-pay

## 2022-01-20 ENCOUNTER — Other Ambulatory Visit (HOSPITAL_COMMUNITY): Payer: 59 | Attending: Psychiatry | Admitting: Licensed Clinical Social Worker

## 2022-01-20 ENCOUNTER — Other Ambulatory Visit (HOSPITAL_COMMUNITY): Payer: 59 | Attending: Psychiatry

## 2022-01-20 ENCOUNTER — Ambulatory Visit (HOSPITAL_COMMUNITY): Payer: 59

## 2022-01-20 DIAGNOSIS — F332 Major depressive disorder, recurrent severe without psychotic features: Secondary | ICD-10-CM | POA: Diagnosis not present

## 2022-01-20 DIAGNOSIS — R4589 Other symptoms and signs involving emotional state: Secondary | ICD-10-CM

## 2022-01-20 DIAGNOSIS — F411 Generalized anxiety disorder: Secondary | ICD-10-CM | POA: Insufficient documentation

## 2022-01-20 DIAGNOSIS — F4310 Post-traumatic stress disorder, unspecified: Secondary | ICD-10-CM | POA: Diagnosis present

## 2022-01-20 NOTE — Therapy (Signed)
[place], [place], [address] Phone: [phone]   Fax:  [phone]  Occupational Therapy Evaluation Virtual Visit via Video Note  I connected with [name] on 01/20/22 at  8:00 AM EDT by a video enabled telemedicine application and verified that I am speaking with the correct person using two identifiers.  Location: Patient: home Provider: office   I discussed the limitations of evaluation and management by telemedicine and the availability of in person appointments. The patient expressed understanding and agreed to proceed.    The patient was advised to call back or seek an in-person evaluation if the symptoms worsen or if the condition fails to improve as anticipated.  I provided 85 minutes of non-face-to-face time during this encounter.   Patient Details  Name: Anna Woodard MRN: [id number] Date of Birth: July 23, 1990 No data recorded  Encounter Date: 01/20/2022   OT End of Session - 01/20/22 1825     Visit Number 1    Number of Visits 20    Date for OT Re-Evaluation 02/19/22    OT Start Time 1000    OT Stop Time 4627   Eval: 16; Tx: 55 / total: 85   OT Time Calculation (min) 175 min    Activity Tolerance Patient tolerated treatment well    Behavior During Therapy WFL for tasks assessed/performed             Past Medical History:  Diagnosis Date   Anxiety and depression    Cancer ([place])    CERBVICAL   CIN III (cervical intraepithelial neoplasia grade III) with severe dysplasia    Complication of anesthesia    WAKES UP IN PANIC / CRYING   Depression    HPV (human papilloma virus) infection    Panic disorder    Panic disorder    PTSD (post-traumatic stress disorder)     Past Surgical History:  Procedure Laterality Date   BARTHOLIN CYST MARSUPIALIZATION N/A 09/09/2017   Procedure: BARTHOLIN CYST MARSUPIALIZATION;  Surgeon: Will Bonnet, MD;  Location: [facility] ORS;   Service: Gynecology;  Laterality: N/A;   CERVICAL BIOPSY  W/ LOOP ELECTRODE EXCISION     DENTAL SURGERY      There were no vitals filed for this visit.   Subjective Assessment - 01/20/22 1822     Pertinent History MDD, GAD, PTSD, BPD, Panic disorder    Limitations coping / interests / routines and habits /    Patient Stated Goals to improve psychosocial areas in deficit by DC    Currently in Pain? No/denies    Pain Score 0-No pain    Multiple Pain Sites No                OT Assessment  Diagnosis: MDD, GAD, PTSD Past medical history/referral information: na Living situation: husband / children ADLs: independent  Work: Museum/gallery exhibitions officer Leisure: nature  Social support: intact Struggles: goals, routines and coping skills OT goal: to be independent in these psychosocial domains that are currently in deficit  [place] Summary of Client Scores:  Facilitates participation in occupation Allows participation in occupation Inhibits participation in occupation Restricts participation in occupation Comments:  Roles    X   Habits    X   Personal Causation   X    Values X      Interests X      Skills   X    [place]   X  Long-term Goals   X    Interpretation of Past Experiences    X   Physical Environment    X   Social Environment   X    Readiness for Change   X      Need for Occupational Therapy:          2 Need for OT intervention indicated to restore/improve participation      Assessment:  Patient demonstrates behavior that INHIBITS participation in occupation.  Patient will benefit from occupational therapy intervention in order to improve time management, financial management, stress management, job readiness skills, social skills, and health management skills in preparation to return to full time community living and to be a productive community member.    Plan:  Patient will participate in skilled occupational therapy sessions individually  or in a group setting to improve coping skills, psychosocial skills, and emotional skills required to return to prior level of function. Treatment will be 4-5 times per week for 4 weeks.       Group Session:  S: Looking forward to learning new skills in this group to improve these areas of my life  O:  The objective of this group therapy discussion is to explore the significance of routines and how approaching them from a systems and goals perspective can improve overall mental health and wellbeing. By sharing experiences and insights, participants will gain a deeper understanding of the benefits of routines and learn practical strategies for implementing and maintaining effective routines in various settings. This discussion aims to empower individuals in an inpatient hospital setting, as well as those transitioning to home and the community, to take control of their mental health journey through structured routines and goal-oriented approaches. In this group therapy session, we will delve into the power of routines and how they can positively impact our mental health and wellbeing. We will explore the concept of approaching routines from a systems and goals perspective, recognizing the interconnectedness of various aspects of our lives and the importance of setting meaningful goals. Together, we will discuss practical ideas and strategies for implementing effective routines in the inpatient setting, as well as maintaining them when transitioning back home and into the community.  Through open and supportive dialogue, we will share personal experiences, challenges, and successes, allowing each participant to gain valuable insights and tools for fostering positive change in their lives. This discussion aims to empower and inspire individuals to embrace routines as a pathway to improved mental health and overall wellbeing.  A: During the group therapy session, the patient demonstrated high levels of  engagement and active participation. They openly shared their personal experiences with routines and expressed a genuine interest in understanding how to optimize their impact on mental health. Patient actively listened to others' perspectives, asked insightful questions, and provided valuable input based on their own experiences. They demonstrated a willingness to explore new ideas and strategies, eagerly contributing to the discussion by offering practical suggestions for implementing effective routines both in the hospital setting and during the transition to home and the community. Patient's enthusiasm and proactive approach served as a source of inspiration and motivation for other group members, fostering a positive and supportive atmosphere.  P: Continue to attend PHP OT group sessions 5x week for 4 weeks to promote daily structure, social engagement, and opportunities to develop and utilize adaptive strategies to maximize functional performance in preparation for safe transition and integration back into school, work, and the community. Plan to address topic of  Pt 3 in next OT group session.              OT Education - 01/20/22 1824     Education Details OT Eval / Renelda Mom / Systems and Goals Tx group    Person(s) Educated Patient    Methods Explanation;Handout    Comprehension Verbalized understanding              OT Short Term Goals - 01/20/22 1827       OT SHORT TERM GOAL #1   Title Pt will be independent w/ implementing effective and healthy coping skills at time of DC    Time 4    Period Weeks    Status New    Target Date 02/19/22      OT SHORT TERM GOAL #2   Title Pt will be independent w/ goal formation and implementation at time of DC    Time 4    Period Weeks    Status New    Target Date 02/19/22      OT SHORT TERM GOAL #3   Title Pt will be independent w/ implementing sustainable routines and habits at time of DC    Time 4    Period Weeks    Status New     Target Date 02/19/22                      Plan - 01/20/22 1825     Clinical Impression Statement Pt presents w/ deficits across multiple psychosocial domains that limit her ability to initiate and engage in her basic daily tasks and socialization / communication w/ others    OT Occupational Profile and History Problem Focused Assessment - Including review of records relating to presenting problem    Occupational performance deficits (Please refer to evaluation for details): ADL's;IADL's;Rest and Sleep;Work;Leisure;Social Participation    Psychosocial Skills Coping Strategies;Environmental  Adaptations;Habits;Interpersonal Interaction;Routines and Behaviors    Rehab Potential Good    Clinical Decision Making Limited treatment options, no task modification necessary    Comorbidities Affecting Occupational Performance: None    Modification or Assistance to Complete Evaluation  No modification of tasks or assist necessary to complete eval    OT Frequency 5x / week    OT Duration 4 weeks    OT Treatment/Interventions Psychosocial skills training;Coping strategies training;Patient/family education    Consulted and Agree with Plan of Care Patient             Patient will benefit from skilled therapeutic intervention in order to improve the following deficits and impairments:       Psychosocial Skills: Coping Strategies, Environmental  Adaptations, Habits, Interpersonal Interaction, Routines and Behaviors   Visit Diagnosis: Difficulty coping  GAD (generalized anxiety disorder)    Problem List Patient Active Problem List   Diagnosis Date Noted   GAD (generalized anxiety disorder) 01/14/2022   MDD (major depressive disorder), recurrent episode, severe ([place]) 01/14/2022   Allergic rhinitis 01/13/2022   Exercise induced bronchospasm 01/13/2022   Idiopathic urticaria 01/13/2022   Seafood allergy 01/13/2022   Allergy to sulfonamides 01/13/2022   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[place]) 01/13/2022   Alcohol use disorder, moderate, in early remission ([place]) 09/15/2021   Bipolar 1 disorder, mixed ([place]) 03/04/2021   PTSD (post-traumatic stress disorder) 03/04/2021   Panic disorder 03/04/2021   Alcohol use disorder, moderate, dependence ([place]) 03/04/2021   Cannabis use disorder, mild, abuse 03/04/2021   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

## 2022-01-20 NOTE — Psych (Signed)
Virtual Visit via Video Note  I connected with Anna Woodard on 01/20/22 at  9:00 AM EDT by a video enabled telemedicine application and verified that I am speaking with the correct person using two identifiers.  Location: Patient: pt's home Provider: clinical home office   I discussed the limitations of evaluation and management by telemedicine and the availability of in person appointments. The patient expressed understanding and agreed to proceed.   I discussed the assessment and treatment plan with the patient. The patient was provided an opportunity to ask questions and all were answered. The patient agreed with the plan and demonstrated an understanding of the instructions.   The patient was advised to call back or seek an in-person evaluation if the symptoms worsen or if the condition fails to improve as anticipated.  I provided 240 minutes of non-face-to-face time during this encounter.   Jarome Matin   [facility] PHP THERAPIST PROGRESS NOTE  Tonyia Marschall [phone]   Session Time: 9:00 am - 10:00 am  Participation Level: Active  Behavioral Response: CasualAlertAnxious, Depressed, and Labile  Type of Therapy: Group Therapy  Treatment Goals addressed: Coping  Progress Towards Goals: Initial  Interventions: CBT, DBT, Solution Focused, Strength-based, Supportive, and Reframing  Therapist Response: Clinician led check-in regarding current stressors and situation, and review of patient completed daily inventory. Clinician utilized active listening and empathetic response and validated patient emotions. Clinician facilitated processing group on pertinent issues.?   Summary: Anna Woodard is a 31yo female who presents with anxiety, depression, and PTSD symptoms. Patient arrived within time allowed. Patient rates her mood at a 5 on a scale of 1-10 with 10 being best. Pt reported, "I'm still trying to gauge my emotions. What is normal, what is too much, what is not  enough." She reports she slept well and her appetite was "healthy" yesterday. She denies SI/SH thoughts. She reports she would like to discuss "Figuring out what normal is supposed to be" during processing. Pt able to process.?Pt engaged in discussion. She was tearful at times during check-in.    Session Time: 10:00 am - 11:00 am  Participation Level: Active  Behavioral Response: CasualAlertAnxious, Depressed, and Labile  Type of Therapy: Group Therapy  Treatment Goals addressed: Coping  Progress Towards Goals: Initial  Interventions: CBT, DBT, Solution Focused, Strength-based, Supportive, and Reframing  Therapist Response: Clinician led group on coping with strong emotions, feeling overwhelmed, and negativity from others. Clinician utilized CBT principles to inform discussion.   Summary: Pt engaged in discussion. Pt states she wants to know what normal is supposed to look like. She also states her mother has called her a failure and that she will lose her children, and that she feels guilty for screening her calls. She is receptive to feedback from cln and peers. She continues to be tearful on and off during discussion.    Session Time: 11:00 am - 12:00 pm  Participation Level: Active  Behavioral Response: CasualAlertAnxious and Depressed  Type of Therapy: Group Therapy, Spiritual Care  Treatment Goals addressed: Coping  Progress Towards Goals: Initial  Interventions: Supportive, Education  Therapist Response: [name], Chaplain, led group.  Summary: Pt participated in discussion.   Session Time: 12:00 pm - 1:00 pm  Participation Level: Active  Behavioral Response: CasualAlertAnxious and Depressed  Type of Therapy: Group Therapy  Treatment Goals addressed: Coping  Progress Towards Goals: Initial  Interventions: CBT, DBT, Solution Focused, Strength-based, Supportive, and Reframing  Therapist Response: 12:00 - 12:50 pm: See OT  note. 12:50 - 1:00 pm: Clinician  led check-out. Clinician assessed for immediate needs, medication compliance and efficacy, and safety concerns?  Summary: 12:00 - 12:50 pm: See OT note. 12:50 - 1:00 pm: At check-out, patient contracts for safety. Patient denies SI/HI/self-harm thoughts at the end of group and agrees to seek help should those thoughts/feelings occur.?   Suicidal/Homicidal: Nowithout intent/plan  Plan: ?Pt will continue in PHP and medication management while continuing to work on decreasing depression symptoms,?SI, and anxiety symptoms,?and increasing the ability to self manage symptoms.    Collaboration of Care: Medication Management AEB Dr. Rosita Kea  Patient/Guardian was advised Release of Information must be obtained prior to any record release in order to collaborate their care with an outside provider. Patient/Guardian was advised if they have not already done so to contact the registration department to sign all necessary forms in order for [place] to release information regarding their care.   Consent: Patient/Guardian gives verbal consent for treatment and assignment of benefits for services provided during this visit. Patient/Guardian expressed understanding and agreed to proceed.   Diagnosis: Severe episode of recurrent major depressive disorder, without psychotic features ([place]) [F33.2]    1. Severe episode of recurrent major depressive disorder, without psychotic features ([place])   2. GAD (generalized anxiety disorder)   3. PTSD (post-traumatic stress disorder)       Heron Nay, Latanya Presser 01/20/2022

## 2022-01-21 ENCOUNTER — Other Ambulatory Visit (HOSPITAL_COMMUNITY): Payer: 59 | Attending: Psychiatry

## 2022-01-21 ENCOUNTER — Encounter (HOSPITAL_COMMUNITY): Payer: Self-pay

## 2022-01-21 ENCOUNTER — Other Ambulatory Visit (HOSPITAL_COMMUNITY): Payer: 59 | Attending: Psychiatry | Admitting: Licensed Clinical Social Worker

## 2022-01-21 DIAGNOSIS — F4310 Post-traumatic stress disorder, unspecified: Secondary | ICD-10-CM | POA: Insufficient documentation

## 2022-01-21 DIAGNOSIS — Z79899 Other long term (current) drug therapy: Secondary | ICD-10-CM | POA: Insufficient documentation

## 2022-01-21 DIAGNOSIS — F411 Generalized anxiety disorder: Secondary | ICD-10-CM | POA: Insufficient documentation

## 2022-01-21 DIAGNOSIS — F319 Bipolar disorder, unspecified: Secondary | ICD-10-CM | POA: Insufficient documentation

## 2022-01-21 DIAGNOSIS — F329 Major depressive disorder, single episode, unspecified: Secondary | ICD-10-CM | POA: Diagnosis not present

## 2022-01-21 DIAGNOSIS — R4589 Other symptoms and signs involving emotional state: Secondary | ICD-10-CM | POA: Diagnosis present

## 2022-01-21 DIAGNOSIS — F410 Panic disorder [episodic paroxysmal anxiety] without agoraphobia: Secondary | ICD-10-CM | POA: Insufficient documentation

## 2022-01-21 DIAGNOSIS — F1994 Other psychoactive substance use, unspecified with psychoactive substance-induced mood disorder: Secondary | ICD-10-CM | POA: Diagnosis not present

## 2022-01-21 DIAGNOSIS — F1021 Alcohol dependence, in remission: Secondary | ICD-10-CM | POA: Diagnosis not present

## 2022-01-21 DIAGNOSIS — F332 Major depressive disorder, recurrent severe without psychotic features: Secondary | ICD-10-CM | POA: Diagnosis not present

## 2022-01-21 DIAGNOSIS — F1290 Cannabis use, unspecified, uncomplicated: Secondary | ICD-10-CM | POA: Insufficient documentation

## 2022-01-21 MED ORDER — VENLAFAXINE HCL ER 37.5 MG PO CP24: 37.5000 mg | ORAL_CAPSULE | Freq: Every day | ORAL | 0 refills | Status: AC

## 2022-01-21 NOTE — Therapy (Signed)
[place], [place], [address] Phone: [phone]   Fax:  [phone]  Occupational Therapy Treatment Virtual Visit via Video Note  I connected with [name] on 01/21/22 at  8:00 AM EDT by a video enabled telemedicine application and verified that I am speaking with the correct person using two identifiers.  Location: Patient: home Provider: office   I discussed the limitations of evaluation and management by telemedicine and the availability of in person appointments. The patient expressed understanding and agreed to proceed.    The patient was advised to call back or seek an in-person evaluation if the symptoms worsen or if the condition fails to improve as anticipated.  I provided 55 minutes of non-face-to-face time during this encounter.   Patient Details  Name: Anna Woodard MRN: [id number] Date of Birth: 21-Nov-1990 No data recorded  Encounter Date: 01/21/2022   OT End of Session - 01/21/22 1702     Visit Number 2    Number of Visits 20    Date for OT Re-Evaluation 02/19/22    OT Start Time 1200    OT Stop Time 1255    OT Time Calculation (min) 55 min             Past Medical History:  Diagnosis Date   Anxiety and depression    Cancer ([place])    CERBVICAL   CIN III (cervical intraepithelial neoplasia grade III) with severe dysplasia    Complication of anesthesia    WAKES UP IN PANIC / CRYING   Depression    HPV (human papilloma virus) infection    Panic disorder    Panic disorder    PTSD (post-traumatic stress disorder)     Past Surgical History:  Procedure Laterality Date   BARTHOLIN CYST MARSUPIALIZATION N/A 09/09/2017   Procedure: BARTHOLIN CYST MARSUPIALIZATION;  Surgeon: Will Bonnet, MD;  Location: [facility] ORS;  Service: Gynecology;  Laterality: N/A;   CERVICAL BIOPSY  W/ LOOP ELECTRODE EXCISION     DENTAL SURGERY      There were no vitals filed for this  visit.   Subjective Assessment - 01/21/22 1701     Currently in Pain? No/denies    Pain Score 0-No pain                Group Session:  S: Better today  O: The objective of this group therapy discussion is to explore the significance of routines and how approaching them from a systems and goals perspective can improve overall mental health and wellbeing. By sharing experiences and insights, participants will gain a deeper understanding of the benefits of routines and learn practical strategies for implementing and maintaining effective routines in various settings. This discussion aims to empower individuals in an inpatient hospital setting, as well as those transitioning to home and the community, to take control of their mental health journey through structured routines and goal-oriented approaches. In this group therapy session, we will delve into the power of routines and how they can positively impact our mental health and wellbeing. We will explore the concept of approaching routines from a systems and goals perspective, recognizing the interconnectedness of various aspects of our lives and the importance of setting meaningful goals. Together, we will discuss practical ideas and strategies for implementing effective routines in the inpatient setting, as well as maintaining them when transitioning back home and into the community.  Through open and supportive dialogue, we will share personal  experiences, challenges, and successes, allowing each participant to gain valuable insights and tools for fostering positive change in their lives. This discussion aims to empower and inspire individuals to embrace routines as a pathway to improved mental health and overall wellbeing.   A: During the group therapy session, the patient demonstrated high levels of engagement and active participation. They openly shared their personal experiences with routines and expressed a genuine interest in  understanding how to optimize their impact on mental health. Patient actively listened to others' perspectives, asked insightful questions, and provided valuable input based on their own experiences. They demonstrated a willingness to explore new ideas and strategies, eagerly contributing to the discussion by offering practical suggestions for implementing effective routines both in the hospital setting and during the transition to home and the community. Patient's enthusiasm and proactive approach served as a source of inspiration and motivation for other group members, fostering a positive and supportive atmosphere.  P: Continue to attend PHP OT group sessions 5x week for 4 weeks to promote daily structure, social engagement, and opportunities to develop and utilize adaptive strategies to maximize functional performance in preparation for safe transition and integration back into school, work, and the community. Plan to address topic of Routine / Systems and Goals Wrap Up in next OT group session.                   OT Education - 01/21/22 1702     Education Details Routines, Systems and Goals              OT Short Term Goals - 01/20/22 1827       OT SHORT TERM GOAL #1   Title Pt will be independent w/ implementing effective and healthy coping skills at time of DC    Time 4    Period Weeks    Status New    Target Date 02/19/22      OT SHORT TERM GOAL #2   Title Pt will be independent w/ goal formation and implementation at time of DC    Time 4    Period Weeks    Status New    Target Date 02/19/22      OT SHORT TERM GOAL #3   Title Pt will be independent w/ implementing sustainable routines and habits at time of DC    Time 4    Period Weeks    Status New    Target Date 02/19/22                      Plan - 01/21/22 1702     Psychosocial Skills Coping Strategies;Environmental  Adaptations;Habits;Interpersonal Interaction;Routines and Behaviors              Patient will benefit from skilled therapeutic intervention in order to improve the following deficits and impairments:       Psychosocial Skills: Coping Strategies, Environmental  Adaptations, Habits, Interpersonal Interaction, Routines and Behaviors   Visit Diagnosis: Difficulty coping    Problem List Patient Active Problem List   Diagnosis Date Noted   GAD (generalized anxiety disorder) 01/14/2022   MDD (major depressive disorder), recurrent episode, severe ([place]) 01/14/2022   Allergic rhinitis 01/13/2022   Exercise induced bronchospasm 01/13/2022   Idiopathic urticaria 01/13/2022   Seafood allergy 01/13/2022   Allergy to sulfonamides 01/13/2022   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[place]) 01/13/2022   Alcohol use disorder, moderate, in early remission ([place]) 09/15/2021   Bipolar 1 disorder,  mixed ([place]) 03/04/2021   PTSD (post-traumatic stress disorder) 03/04/2021   Panic disorder 03/04/2021   Alcohol use disorder, moderate, dependence ([place]) 03/04/2021   Cannabis use disorder, mild, abuse 03/04/2021   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

## 2022-01-21 NOTE — Psych (Signed)
Virtual Visit via Video Note  I connected with Anna Woodard on 01/21/22 at  9:00 AM EDT by a video enabled telemedicine application and verified that I am speaking with the correct person using two identifiers.  Location: Patient: pt's home Provider: clinical home office   I discussed the limitations of evaluation and management by telemedicine and the availability of in person appointments. The patient expressed understanding and agreed to proceed.   I discussed the assessment and treatment plan with the patient. The patient was provided an opportunity to ask questions and all were answered. The patient agreed with the plan and demonstrated an understanding of the instructions.   The patient was advised to call back or seek an in-person evaluation if the symptoms worsen or if the condition fails to improve as anticipated.  I provided 240 minutes of non-face-to-face time during this encounter.   Jarome Matin   [facility] PHP THERAPIST PROGRESS NOTE  Anna Woodard [id number]   Session Time: 9:00 am - 10:00 am  Participation Level: Active  Behavioral Response: CasualAlertAnxious and Depressed  Type of Therapy: Group Therapy  Treatment Goals addressed: Coping  Progress Towards Goals: Progressing  Interventions: CBT, DBT, Solution Focused, Strength-based, Supportive, and Reframing  Therapist Response: Clinician led check-in regarding current stressors and situation, and review of patient completed daily inventory. Clinician utilized active listening and empathetic response and validated patient emotions. Clinician facilitated processing group on pertinent issues.?   Summary: Patient arrived within time allowed. Patient rates her mood at an 8/9 on a scale of 1-10 with 10 being best. Pt reported, "I actually slept really well last night for the first time in a while. Yesterday I had the best appetite I've had in weeks." She also reports her friend came over yesterday and she  enjoyed that. Pt denied experiencing SI/SH thoughts since last session. Pt stated she does not have a particular topic to discuss during processing. Pt able to process.?Pt engaged in discussion.?      Session Time: 10:00 am - 11:00 am  Participation Level: Active  Behavioral Response: CasualAlertAnxious and Depressed  Type of Therapy: Group Therapy  Treatment Goals addressed: Coping  Progress Towards Goals: Progressing  Interventions: CBT, DBT, Solution Focused, Strength-based, Supportive, and Reframing  Therapist Response: Clinician led group on communication, returning to one's routine after discharge from group, and distress tolerance/mindfulness. Cln utilized CBT and DBT principles to inform discussion.   Summary: Pt engaged in discussion. 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 She states she feels stuck, like she doesn't have a purpose. She also states she does not want to put her youngest child in daycare because she believes she would be abandoning her child. She is receptive to feedback from cln and peers.     Session Time: 11:00 am - 12:00 pm  Participation Level: Active  Behavioral Response: CasualAlertAnxious and Depressed  Type of Therapy: Group Therapy  Treatment Goals addressed: Coping  Progress Towards Goals: Progressing  Interventions: CBT, DBT, Solution Focused, Strength-based, Supportive, and Reframing  Therapist Response: Clinician led discussion on anxiety. Clinician began with psychoeducation video explaining the brain's fear response and then discussion was held around the benefits of exposure therapy techniques to reduce anxiety responses. Clinician utilized CBT principles to inform discussion.   Summary: Pt engaged in discussion. She states the education is helpful and expresses that she is surprised that the amygdala is so small and can  cause so much trouble. She also expresses she feels validated in  understanding the physiology behind anxiety.    Session Time: 12:00 pm - 1:00 pm  Participation Level: Active  Behavioral Response: CasualAlertAnxious and Depressed  Type of Therapy: Group Therapy  Treatment Goals addressed: Coping  Progress Towards Goals: Progressing  Interventions: CBT, DBT, Solution Focused, Strength-based, Supportive, and Reframing  Therapist Response: 12:00 - 12:50 pm: See OT note. 12:50 - 1:00 pm: Clinician led check-out. Clinician assessed for immediate needs, medication compliance and efficacy, and safety concerns?  Summary: 12:00 - 12:50 pm: See OT note 12:50 - 1:00 pm: At check-out, patient contracts for safety.?Patient demonstrates progress as evidenced by her continued engagement and by being receptive to treatment. Patient denies SI/HI/self-harm thoughts at the end of group and agrees to seek help should those thoughts/feelings occur.?   Suicidal/Homicidal: Nowithout intent/plan  Plan: ?Pt will continue in PHP and medication management while continuing to work on decreasing depression symptoms,?SI, and anxiety symptoms,?and increasing the ability to self manage symptoms.   Collaboration of Care: Medication Management AEB Dr. Rosita Kea  Patient/Guardian was advised Release of Information must be obtained prior to any record release in order to collaborate their care with an outside provider. Patient/Guardian was advised if they have not already done so to contact the registration department to sign all necessary forms in order for [place] to release information regarding their care.   Consent: Patient/Guardian gives verbal consent for treatment and assignment of benefits for services provided during this visit. Patient/Guardian expressed understanding and agreed to proceed.   Diagnosis: Severe episode of recurrent major depressive disorder, without psychotic features ([place]) [F33.2]    1. Severe episode of recurrent major depressive disorder, without psychotic  features ([place])   2. GAD (generalized anxiety disorder)   3. PTSD (post-traumatic stress disorder)   4. Panic disorder   5. 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[place])   6. Cannabis use disorder   7. Alcohol use disorder, moderate, in early remission [facility])       Heron Nay, LCSWA 01/21/2022
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
## 2022-01-22 ENCOUNTER — Other Ambulatory Visit (HOSPITAL_COMMUNITY): Payer: 59 | Attending: Psychiatry | Admitting: Licensed Clinical Social Worker

## 2022-01-22 ENCOUNTER — Other Ambulatory Visit (HOSPITAL_COMMUNITY): Payer: 59 | Attending: Psychiatry

## 2022-01-22 ENCOUNTER — Encounter (HOSPITAL_COMMUNITY): Payer: Self-pay

## 2022-01-22 DIAGNOSIS — F329 Major depressive disorder, single episode, unspecified: Secondary | ICD-10-CM | POA: Insufficient documentation

## 2022-01-22 DIAGNOSIS — R4589 Other symptoms and signs involving emotional state: Secondary | ICD-10-CM

## 2022-01-22 DIAGNOSIS — F411 Generalized anxiety disorder: Secondary | ICD-10-CM | POA: Insufficient documentation

## 2022-01-22 DIAGNOSIS — F332 Major depressive disorder, recurrent severe without psychotic features: Secondary | ICD-10-CM | POA: Insufficient documentation

## 2022-01-22 DIAGNOSIS — F4310 Post-traumatic stress disorder, unspecified: Secondary | ICD-10-CM | POA: Diagnosis not present

## 2022-01-22 DIAGNOSIS — F1021 Alcohol dependence, in remission: Secondary | ICD-10-CM | POA: Diagnosis not present

## 2022-01-22 NOTE — Therapy (Signed)
[address] [place], [place], [address] Phone: [phone]   Fax:  [phone]  Occupational Therapy Treatment Virtual Visit via Video Note  I connected with Anna Woodard on 01/22/22 at  8:00 AM EDT by a video enabled telemedicine application and verified that I am speaking with the correct person using two identifiers.  Location: Patient: home Provider: office   I discussed the limitations of evaluation and management by telemedicine and the availability of in person appointments. The patient expressed understanding and agreed to proceed.    The patient was advised to call back or seek an in-person evaluation if the symptoms worsen or if the condition fails to improve as anticipated.  I provided 55 minutes of non-face-to-face time during this encounter.   Patient Details  Name: Anna Woodard MRN: [id number] Date of Birth: November 10, 1990 No data recorded  Encounter Date: 01/22/2022   OT End of Session - 01/22/22 1850     Visit Number 3    Number of Visits 20    Date for OT Re-Evaluation 02/19/22    OT Start Time 1200    OT Stop Time 1255    OT Time Calculation (min) 55 min             Past Medical History:  Diagnosis Date   Anxiety and depression    Cancer ([place])    CERBVICAL   CIN III (cervical intraepithelial neoplasia grade III) with severe dysplasia    Complication of anesthesia    WAKES UP IN PANIC / CRYING   Depression    HPV (human papilloma virus) infection    Panic disorder    Panic disorder    PTSD (post-traumatic stress disorder)     Past Surgical History:  Procedure Laterality Date   BARTHOLIN CYST MARSUPIALIZATION N/A 09/09/2017   Procedure: BARTHOLIN CYST MARSUPIALIZATION;  Surgeon: Will Bonnet, MD;  Location: [facility] ORS;  Service: Gynecology;  Laterality: N/A;   CERVICAL BIOPSY  W/ LOOP ELECTRODE EXCISION     DENTAL SURGERY      There were no vitals filed for this  visit.   Subjective Assessment - 01/22/22 1849     Currently in Pain? No/denies    Pain Score 0-No pain                Group Session:  S: Doing better today.  O: The objective of this group therapy discussion is to explore the significance of routines and how approaching them from a systems and goals perspective can improve overall mental health and wellbeing. By sharing experiences and insights, participants will gain a deeper understanding of the benefits of routines and learn practical strategies for implementing and maintaining effective routines in various settings. This discussion aims to empower individuals in an inpatient hospital setting, as well as those transitioning to home and the community, to take control of their mental health journey through structured routines and goal-oriented approaches. In this group therapy session, we will delve into the power of routines and how they can positively impact our mental health and wellbeing. We will explore the concept of approaching routines from a systems and goals perspective, recognizing the interconnectedness of various aspects of our lives and the importance of setting meaningful goals. Together, we will discuss practical ideas and strategies for implementing effective routines in the inpatient setting, as well as maintaining them when transitioning back home and into the community.  Through open and supportive dialogue, we will share  personal experiences, challenges, and successes, allowing each participant to gain valuable insights and tools for fostering positive change in their lives. This discussion aims to empower and inspire individuals to embrace routines as a pathway to improved mental health and overall wellbeing.  A: During the group therapy session, the patient demonstrated high levels of engagement and active participation. They openly shared their personal experiences with routines and expressed a genuine interest in  understanding how to optimize their impact on mental health. Patient actively listened to others' perspectives, asked insightful questions, and provided valuable input based on their own experiences. They demonstrated a willingness to explore new ideas and strategies, eagerly contributing to the discussion by offering practical suggestions for implementing effective routines both in the hospital setting and during the transition to home and the community. Patient's enthusiasm and proactive approach served as a source of inspiration and motivation for other group members, fostering a positive and supportive atmosphere.  P: Continue to attend PHP OT group sessions 5x week for 4 weeks to promote daily structure, social engagement, and opportunities to develop and utilize adaptive strategies to maximize functional performance in preparation for safe transition and integration back into school, work, and the community. Plan to address topic of tbd in next OT group session.                   OT Education - 01/22/22 1849     Education Details Routines, Systems and Goals Review and Discussion              OT Short Term Goals - 01/20/22 1827       OT SHORT TERM GOAL #1   Title Pt will be independent w/ implementing effective and healthy coping skills at time of DC    Time 4    Period Weeks    Status New    Target Date 02/19/22      OT SHORT TERM GOAL #2   Title Pt will be independent w/ goal formation and implementation at time of DC    Time 4    Period Weeks    Status New    Target Date 02/19/22      OT SHORT TERM GOAL #3   Title Pt will be independent w/ implementing sustainable routines and habits at time of DC    Time 4    Period Weeks    Status New    Target Date 02/19/22                      Plan - 01/22/22 1850     Psychosocial Skills Coping Strategies;Environmental  Adaptations;Habits;Interpersonal Interaction;Routines and Behaviors              Patient will benefit from skilled therapeutic intervention in order to improve the following deficits and impairments:       Psychosocial Skills: Coping Strategies, Environmental  Adaptations, Habits, Interpersonal Interaction, Routines and Behaviors   Visit Diagnosis: Difficulty coping    Problem List Patient Active Problem List   Diagnosis Date Noted   GAD (generalized anxiety disorder) 01/14/2022   MDD (major depressive disorder), recurrent episode, severe ([place]) 01/14/2022   Allergic rhinitis 01/13/2022   Exercise induced bronchospasm 01/13/2022   Idiopathic urticaria 01/13/2022   Seafood allergy 01/13/2022   Allergy to sulfonamides 01/13/2022   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[place]) 01/13/2022   Alcohol use disorder, moderate, in early remission ([place]) 09/15/2021   Bipolar 1 disorder, mixed ([place]) 03/04/2021  PTSD (post-traumatic stress disorder) 03/04/2021   Panic disorder 03/04/2021   Alcohol use disorder, moderate, dependence ([place]) 03/04/2021   Cannabis use disorder, mild, abuse 03/04/2021   

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
## 2022-01-25 ENCOUNTER — Other Ambulatory Visit (HOSPITAL_COMMUNITY): Payer: 59 | Attending: Psychiatry

## 2022-01-25 ENCOUNTER — Other Ambulatory Visit (HOSPITAL_COMMUNITY): Payer: 59 | Attending: Psychiatry | Admitting: Licensed Clinical Social Worker

## 2022-01-25 ENCOUNTER — Encounter (HOSPITAL_COMMUNITY): Payer: Self-pay

## 2022-01-25 DIAGNOSIS — F410 Panic disorder [episodic paroxysmal anxiety] without agoraphobia: Secondary | ICD-10-CM | POA: Diagnosis not present

## 2022-01-25 DIAGNOSIS — F411 Generalized anxiety disorder: Secondary | ICD-10-CM | POA: Diagnosis not present

## 2022-01-25 DIAGNOSIS — F329 Major depressive disorder, single episode, unspecified: Secondary | ICD-10-CM | POA: Insufficient documentation

## 2022-01-25 DIAGNOSIS — F4310 Post-traumatic stress disorder, unspecified: Secondary | ICD-10-CM | POA: Insufficient documentation

## 2022-01-25 DIAGNOSIS — R4589 Other symptoms and signs involving emotional state: Secondary | ICD-10-CM | POA: Diagnosis present

## 2022-01-25 DIAGNOSIS — F332 Major depressive disorder, recurrent severe without psychotic features: Secondary | ICD-10-CM | POA: Insufficient documentation
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
## 2022-01-25 NOTE — Telephone Encounter (Signed)
Cln emailed pt: "Hi Anna Woodard. We have a limited number of slots for new patients each day because PHP requires a prior authorization. Because of that, you will not be able to start until Wednesday. If you could please resubmit page two of the php intake, which is the release of information form for an emergency contact person, you can start group on 9/13. On that page, you entered your name and phone number on the top line and then you signed and dated the bottom. On the top line, we require an emergency contact."

## 2022-01-25 NOTE — Therapy (Signed)
[address] [place], [place], [address] Phone: [phone]   Fax:  [phone]  Occupational Therapy Treatment Virtual Visit via Video Note  I connected with Anna Woodard on 01/25/22 at  8:00 AM EDT by a video enabled telemedicine application and verified that I am speaking with the correct person using two identifiers.  Location: Patient: home Provider: office   I discussed the limitations of evaluation and management by telemedicine and the availability of in person appointments. The patient expressed understanding and agreed to proceed.    The patient was advised to call back or seek an in-person evaluation if the symptoms worsen or if the condition fails to improve as anticipated.  I provided 55 minutes of non-face-to-face time during this encounter.   Patient Details  Name: Anna Woodard MRN: [id number] Date of Birth: 11-19-1990 No data recorded  Encounter Date: 01/25/2022   OT End of Session - 01/25/22 1731     Visit Number 4    Number of Visits 20    Date for OT Re-Evaluation 02/19/22    OT Start Time 22    OT Stop Time 1255    OT Time Calculation (min) 55 min             Past Medical History:  Diagnosis Date   Anxiety and depression    Cancer ([place])    CERBVICAL   CIN III (cervical intraepithelial neoplasia grade III) with severe dysplasia    Complication of anesthesia    WAKES UP IN PANIC / CRYING   Depression    HPV (human papilloma virus) infection    Panic disorder    Panic disorder    PTSD (post-traumatic stress disorder)     Past Surgical History:  Procedure Laterality Date   BARTHOLIN CYST MARSUPIALIZATION N/A 09/09/2017   Procedure: BARTHOLIN CYST MARSUPIALIZATION;  Surgeon: Will Bonnet, MD;  Location: [facility] ORS;  Service: Gynecology;  Laterality: N/A;   CERVICAL BIOPSY  W/ LOOP ELECTRODE EXCISION     DENTAL SURGERY      There were no vitals filed for this  visit.   Subjective Assessment - 01/25/22 1730     Currently in Pain? No/denies    Pain Score 0-No pain                Group Session:  S: Feeling better today  O: Today's OT group focused on the topic of depression and its impact on self-care. Pt reports struggling to perform basic self-care tasks, including bathing, dressing, and taking medication. The occupational therapist provided education on the impact of depression on self-care and discussed ten specific strategies to improve self-care. The therapist explained how using a shower chair or bath bench could help the patient conserve energy during showering, and how to obtain and use such equipment. The therapist also provided a sample bedtime routine, including taking a warm bath, reading a book, or practicing relaxation techniques, and discussed how this routine could improve the patient's overall sleep hygiene. Additionally, the therapist demonstrated how to use a pill organizer and discussed the importance of wearing comfortable clothing. The therapist also discussed breaking down tasks into smaller steps to make them more manageable and increase motivation and provided examples of how to do so. The therapist provided education on using positive self-talk to encourage the patient's ability to engage in self-care activities, and recommended listening to calming or uplifting music and using scented products such as candles or essential oils  to promote relaxation. Finally, the therapist provided education on mindfulness techniques such as deep breathing, meditation, or yoga, and discussed the importance of establishing social support, including exploring options for support groups or counseling.  A: The patient demonstrated a fair understanding of the specific strategies discussed to improve self-care, including the use of prioritizing, operating w/in the limitations in which you find yourself in for any given day and even the concept  proprioception and gravity technique, and expressed interest in implementing them. The occupational therapist will continue to assess progress in implementing these strategies and provide additional support as needed in future telehealth sessions. The therapist will also continue to address the impact of depression on self-care and explore any additional barriers that may be hindering the patient's ability to engage in self-care activities, including getting out of bed in the morning.  P: Continue to attend PHP OT group sessions 5x week for 4 weeks to promote daily structure, social engagement, and opportunities to develop and utilize adaptive strategies to maximize functional performance in preparation for safe transition and integration back into school, work, and the community. Plan to address topic of pt 2 in next OT group session.                   OT Education - 01/25/22 1730     Education Details Self Care and Depression              OT Short Term Goals - 01/20/22 1827       OT SHORT TERM GOAL #1   Title Pt will be independent w/ implementing effective and healthy coping skills at time of DC    Time 4    Period Weeks    Status New    Target Date 02/19/22      OT SHORT TERM GOAL #2   Title Pt will be independent w/ goal formation and implementation at time of DC    Time 4    Period Weeks    Status New    Target Date 02/19/22      OT SHORT TERM GOAL #3   Title Pt will be independent w/ implementing sustainable routines and habits at time of DC    Time 4    Period Weeks    Status New    Target Date 02/19/22                      Plan - 01/25/22 1731     Psychosocial Skills Coping Strategies;Environmental  Adaptations;Habits;Interpersonal Interaction;Routines and Behaviors             Patient will benefit from skilled therapeutic intervention in order to improve the following deficits and impairments:       Psychosocial Skills: Coping  Strategies, Environmental  Adaptations, Habits, Interpersonal Interaction, Routines and Behaviors   Visit Diagnosis: Difficulty coping    Problem List Patient Active Problem List   Diagnosis Date Noted   GAD (generalized anxiety disorder) 01/14/2022   MDD (major depressive disorder), recurrent episode, severe ([place]) 01/14/2022   Allergic rhinitis 01/13/2022   Exercise induced bronchospasm 01/13/2022   Idiopathic urticaria 01/13/2022   Seafood allergy 01/13/2022   Allergy to sulfonamides 01/13/2022   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[place]) 01/13/2022   Alcohol use disorder, moderate, in early remission ([place]) 09/15/2021   Bipolar 1 disorder, mixed ([place]) 03/04/2021   PTSD (post-traumatic stress disorder) 03/04/2021   Panic disorder 03/04/2021   Alcohol use disorder, moderate, dependence (  [place]) 03/04/2021   Cannabis use disorder, mild, abuse 03/04/2021   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 [place], [place], [address] Phone: [phone]   Fax:  [phone]  Name: Anna Woodard MRN: [id number] Date of Birth: 06/10/1990

## 2022-01-25 NOTE — Progress Notes (Signed)
[facility] MD/PA/NP PHP Progress Note  01/25/2022 2:29 PM Anna Woodard  MRN:  [id number] Virtual Visit via Video Note  I connected with [name] on 01/25/22 at  9:00 AM EDT by a video enabled telemedicine application and verified that I am speaking with the correct person using two identifiers.  Location: Patient: Home Provider: Clinic   I discussed the limitations of evaluation and management by telemedicine and the availability of in person appointments. The patient expressed understanding and agreed to proceed.   I discussed the assessment and treatment plan with the patient. The patient was provided an opportunity to ask questions and all were answered. The patient agreed with the plan and demonstrated an understanding of the instructions.   The patient was advised to call back or seek an in-person evaluation if the symptoms worsen or if the condition fails to improve as anticipated.  I provided 10 minutes of non-face-to-face time during this encounter.   Armando Reichert, MD  Chief Complaint:  Chief Complaint  Patient presents with   Follow-up   HPI: Patient is a 31 year old female with past psychiatric history of GAD, MDD, substance-induced bipolar related disorder, PTSD, panic disorder, alcohol use disorder in remission, cannabis use-mild presented to [facility] program for worsening depression and anxiety.  Patient follows Dr. Shea Evans at [facility]. Pt reports that her mood is "irritable and fidgety".  She reports that she and her husband are currently in the process of buying their first home .  She reports that she has been feeling overwhelmed due to all the paperwork but feels excited at the same time about moving to new house.  She reports improvement in her depression and anxiety.  She has been sleeping and eating well.  Currently, she denies any suicidal ideations, homicidal ideations, auditory and visual hallucinations. She denies any medication side effects and has been tolerating it well. She  reports no change in her current stressors.  She reports that she is not able to relate to other people in the [facility] program and sometimes their religious believes triggers her.  Discussed that if she feels that she is getting triggered then she should talk to the [facility] coordinator.  She verbalizes understanding.  Discussed that the plan is to increase Effexor XR to 75 mg on 9/21 and stop Paxil at the same time.  She agrees with the plan.  Patient is alert and oriented x 4,  calm, cooperative, and fully engaged in conversation during the encounter.  Her thought process is linear with coherent speech . She does not appear to be responding to internal/external stimuli .     Visit Diagnosis:    ICD-10-CM   1. Severe episode of recurrent major depressive disorder, without psychotic features ([place])  F33.2     2. Panic disorder  F41.0     3. GAD (generalized anxiety disorder)  F41.1     4. PTSD (post-traumatic stress disorder)  F43.10       Past Psychiatric History: See initial assessment  Past Medical History:  Past Medical History:  Diagnosis Date   Anxiety and depression    Cancer ([place])    CERBVICAL   CIN III (cervical intraepithelial neoplasia grade III) with severe dysplasia    Complication of anesthesia    WAKES UP IN PANIC / CRYING   Depression    HPV (human papilloma virus) infection    Panic disorder    Panic disorder    PTSD (post-traumatic stress disorder)     Past Surgical  History:  Procedure Laterality Date   BARTHOLIN CYST MARSUPIALIZATION N/A 09/09/2017   Procedure: BARTHOLIN CYST MARSUPIALIZATION;  Surgeon: Will Bonnet, MD;  Location: [facility] ORS;  Service: Gynecology;  Laterality: N/A;   CERVICAL BIOPSY  W/ LOOP ELECTRODE EXCISION     DENTAL SURGERY      Family Psychiatric History: See initial assessment  Family History:  Family History  Problem Relation Age of Onset   Drug abuse Mother    Alcohol abuse Mother    Colon cancer Mother 28   Hypertension Mother     Stroke Mother    Alcohol abuse Father    Mental illness Father    Bipolar disorder Maternal Aunt    Mental illness Paternal Aunt    Mental illness Paternal Uncle    Suicidality Paternal Uncle    Seizures Maternal Grandmother    Breast cancer Other 3   Ovarian cancer Other 69   Suicidality Maternal Great-grandmother     Social History:  Social History   Socioeconomic History   Marital status: Married    Spouse name: anthony   Number of children: 2   Years of education: Not on file   Highest education level: Associate degree: occupational, Hotel manager, or vocational program  Occupational History   Not on file  Tobacco Use   Smoking status: Former    Packs/day: 0.50    Years: 15.00    Total pack years: 7.50    Types: Cigarettes    Quit date: 04/23/2021    Years since quitting: 0.7   Smokeless tobacco: Never  Vaping Use   Vaping Use: Former  Substance and Sexual Activity   Alcohol use: Not Currently    Comment: RARE   Drug use: Yes    Types: Marijuana   Sexual activity: Yes    Birth control/protection: Surgical    Comment: vasectomy  Other Topics Concern   Not on file  Social History Narrative   Not on file   Social Determinants of Health   Financial Resource Strain: Not on file  Food Insecurity: Not on file  Transportation Needs: Not on file  Physical Activity: Not on file  Stress: Not on file  Social Connections: Not on file    Allergies:  Allergies  Allergen Reactions   Shellfish Allergy Anaphylaxis, Swelling and Other (See Comments)    Eyes swelling, thickening tongue   Zithromax [Azithromycin] Anaphylaxis   Molds & Smuts    Naproxen    Other     See List scanned in to Media.   Penicillins     Metabolic Disorder Labs: Lab Results  Component Value Date   HGBA1C 5.1 03/04/2021   MPG 100 03/04/2021   Lab Results  Component Value Date   PROLACTIN 5.1 03/04/2021   Lab Results  Component Value Date   CHOL 127 03/04/2021   TRIG 33  03/04/2021   HDL 46 03/04/2021   CHOLHDL 2.8 03/04/2021   VLDL 7 03/04/2021   LDLCALC 74 03/04/2021   Lab Results  Component Value Date   TSH 0.508 03/04/2021    Therapeutic Level Labs: No results found for: "LITHIUM" No results found for: "VALPROATE" No results found for: "CBMZ"  Current Medications: Current Outpatient Medications  Medication Sig Dispense Refill   ADVAIR DISKUS 250-50 MCG/DOSE AEPB Inhale 1 puff into the lungs 2 (two) times daily.     EPINEPHrine 0.3 mg/0.3 mL IJ SOAJ injection Inject 0.3 mg into the muscle as needed.     famotidine (PEPCID) 40  MG tablet Take 1 tablet by mouth daily.     hydrOXYzine (VISTARIL) 25 MG capsule TAKE 1 CAPSULE(25 MG) BY MOUTH TWICE DAILY AS NEEDED FOR ANXIETY OR SEVERE ANXIETY 180 capsule 0   PARoxetine (PAXIL) 20 MG tablet TAKE 1 TABLET(20 MG) BY MOUTH DAILY WITH BREAKFAST 90 tablet 0   prazosin (MINIPRESS) 1 MG capsule TAKE 1 CAPSULE(1 MG) BY MOUTH AT BEDTIME FOR NIGHTMARES 90 capsule 0   QUEtiapine (SEROQUEL) 100 MG tablet Take 1.5 tablets (150 mg total) by mouth at bedtime. Dose change 45 tablet 1   venlafaxine XR (EFFEXOR XR) 37.5 MG 24 hr capsule Take 1 capsule (37.5 mg total) by mouth daily. 30 capsule 0   No current facility-administered medications for this visit.    Musculoskeletal: Strength & Muscle Tone: Not able to assess due to virtual visit Gait & Station: Not able to assess due to virtual visit Patient leans: N/A   Psychiatric Specialty Exam: Review of Systems  Last menstrual period 12/21/2021, not currently breastfeeding.There is no height or weight on file to calculate BMI.  General Appearance: Casual  Eye Contact:  Good  Speech:  Clear and Coherent and Normal Rate  Volume:  Normal  Mood:  Anxious   Affect: Full range  Thought Process:  Coherent and Linear  Orientation:  Full (Time, Place, and Person)  Thought Content:  Paranoid Ideation  Suicidal Thoughts:  No  Homicidal Thoughts:  No  Memory:   Immediate;   Good Recent;   Good  Judgement:  Fair  Insight:  Good  Psychomotor Activity:  Normal  Concentration:  Concentration: Fair and Attention Span: Fair  Recall:  Good  Fund of Knowledge:Good  Language: Good  Akathisia:  No  Handed:  Right  AIMS (if indicated):  not done  Assets:  Communication Skills Desire for Improvement Financial Resources/Insurance Housing Resilience Social Support Talents/Skills  ADL's:  Intact  Cognition: WNL  Sleep: Fair  Screenings: [place] Office Visit from 01/13/2022 in [place] Video Visit from 09/15/2021 in [place] Total Score 0 0      AUDIT    [place] Office Visit from 03/04/2021 in [place]  Alcohol Use Disorder Identification Test Final Score (AUDIT) 22      GAD-7    Flowsheet Row Counselor from 01/14/2022 in [place] Visit from 01/13/2022 in [place] Video Visit from 09/15/2021 in [place] Office Visit from 04/28/2021 in [place] Office Visit from 03/04/2021 in [place]  Total GAD-7 Score '18 18 1 14 21      '$ PHQ2-9    Flowsheet Row Counselor from 01/25/2022 in [place] Counselor from 01/14/2022 in [place] Visit from 01/13/2022 in [place] Video Visit from 09/15/2021 in [place] Video Visit from 06/12/2021 in [place]  PHQ-2 Total Score '4 6 6 '$ 0 2  PHQ-9 Total Score '13 22 22 1 8      '$ Flowsheet Row Counselor from 01/25/2022 in [place] from 01/14/2022 in [place] Office Visit from  01/13/2022 in [place] Error: Question 6 not populated Error: Q3, 4, or 5 should not be populated when Q2 is No No Risk        Assessment and  Plan: Patient is a 31 year old female with past psychiatric history of GAD, MDD, substance-induced bipolar related disorder, PTSD, panic disorder, alcohol use disorder in remission, cannabis use-mild presented to [facility] program for worsening depression and anxiety.  Patient follows Dr. Shea Evans at [facility].   Patient reports improvement in her depression and anxiety.  We will continue weaning Paxil with a plan to stop on 9/21 (last dose on 9/20).  Will increase Effexor to 75 mg daily on 9/21.  GAD MDD, recurrent, severe with psychotic features PTSD panic disorder   -Continue Seroquel 150 mg nightly for mood stabilization, depression and insomnia -Stop Paxil 10 mg on 9/21. (Last dose on 9/20).  (Weaning and switching to Effexor) -Increase Effexor XR 75 mg daily on 9/21 for  depression and anxiety.  Will titrate as tolerated. (R/b/se/a discussed and patient agrees with medication trial) -Continue prazosin 1 mg nightly for nightmares -Continue hydroxyzine 25 mg twice daily as needed for anxiety. -Recommend starting EMDR therapy.   Follow-up next week   Collaboration of Care: Medication Management Dr Casimiro Needle, Psychiatrist AEB Dr Charlcie Cradle notes, and Other PHP team   Patient/Guardian was advised Release of Information must be obtained prior to any record release in order to collaborate their care with an outside provider. Patient/Guardian was advised if they have not already done so to contact the registration department to sign all necessary forms in order for [place] to release information regarding their care.    Consent: Patient/Guardian gives verbal consent for treatment and assignment of benefits for services provided during this visit. Patient/Guardian expressed understanding and agreed to proceed.      Armando Reichert, MD 01/25/2022, 2:29 PM

## 2022-01-26 ENCOUNTER — Encounter (HOSPITAL_COMMUNITY): Payer: Self-pay

## 2022-01-26 ENCOUNTER — Other Ambulatory Visit (HOSPITAL_COMMUNITY): Payer: 59 | Attending: Psychiatry

## 2022-01-26 ENCOUNTER — Other Ambulatory Visit (HOSPITAL_COMMUNITY): Payer: 59 | Attending: Psychiatry | Admitting: Licensed Clinical Social Worker

## 2022-01-26 DIAGNOSIS — F411 Generalized anxiety disorder: Secondary | ICD-10-CM | POA: Diagnosis not present

## 2022-01-26 DIAGNOSIS — F329 Major depressive disorder, single episode, unspecified: Secondary | ICD-10-CM | POA: Insufficient documentation

## 2022-01-26 DIAGNOSIS — F4310 Post-traumatic stress disorder, unspecified: Secondary | ICD-10-CM | POA: Insufficient documentation

## 2022-01-26 DIAGNOSIS — F332 Major depressive disorder, recurrent severe without psychotic features: Secondary | ICD-10-CM

## 2022-01-26 DIAGNOSIS — Z79899 Other long term (current) drug therapy: Secondary | ICD-10-CM | POA: Insufficient documentation

## 2022-01-26 DIAGNOSIS — F419 Anxiety disorder, unspecified: Secondary | ICD-10-CM | POA: Diagnosis present

## 2022-01-26 DIAGNOSIS — R4589 Other symptoms and signs involving emotional state: Secondary | ICD-10-CM | POA: Diagnosis present

## 2022-01-26 DIAGNOSIS — F410 Panic disorder [episodic paroxysmal anxiety] without agoraphobia: Secondary | ICD-10-CM

## 2022-01-26 NOTE — Progress Notes (Signed)
Plan of care  Patient was seen briefly in [facility] program.  Patient reported that she has been taking 3-4 of hydroxyzine per day to help with her anxiety.  Discussed that as we are switching antidepressants it is expected to have some fluctuation in her mood and anxiety.  Discussed that she can take hydroxyzine  2 times in a day as needed for anxiety  and up to 50 mg of hydroxyzine at night as needed for insomnia.  She agrees with the plan.  Armando Reichert, MD PGY3 Psychiatry Resident  [facility]

## 2022-01-26 NOTE — Therapy (Signed)
[name], [place], [address] Phone: [phone]   Fax:  [phone]  Occupational Therapy Treatment Virtual Visit via Video Note  I connected with Anna Woodard on 01/26/22 at  8:00 AM EDT by a video enabled telemedicine application and verified that I am speaking with the correct person using two identifiers.  Location: Patient: home Provider: office   I discussed the limitations of evaluation and management by telemedicine and the availability of in person appointments. The patient expressed understanding and agreed to proceed.    The patient was advised to call back or seek an in-person evaluation if the symptoms worsen or if the condition fails to improve as anticipated.  I provided 55 minutes of non-face-to-face time during this encounter.   Patient Details  Name: Anna Woodard MRN: [id number] Date of Birth: [birth year] No data recorded  Encounter Date: 01/26/2022   OT End of Session - 01/26/22 1701     Visit Number 5    Number of Visits 20    Date for OT Re-Evaluation 02/19/22    OT Start Time 1200    OT Stop Time 1255    OT Time Calculation (min) 55 min             Past Medical History:  Diagnosis Date   Anxiety and depression    Cancer ([place])    CERBVICAL   CIN III (cervical intraepithelial neoplasia grade III) with severe dysplasia    Complication of anesthesia    WAKES UP IN PANIC / CRYING   Depression    HPV (human papilloma virus) infection    Panic disorder    Panic disorder    PTSD (post-traumatic stress disorder)     Past Surgical History:  Procedure Laterality Date   BARTHOLIN CYST MARSUPIALIZATION N/A 09/09/2017   Procedure: BARTHOLIN CYST MARSUPIALIZATION;  Surgeon: Will Bonnet, MD;  Location: [facility] ORS;  Service: Gynecology;  Laterality: N/A;   CERVICAL BIOPSY  W/ LOOP ELECTRODE EXCISION     DENTAL SURGERY      There were no vitals filed for this  visit.   Subjective Assessment - 01/26/22 1701     Currently in Pain? No/denies    Pain Score 0-No pain                Group Session:  S: Feeling better today  O: In today's OT group, the occupational therapist provided education on the impact of depression on self-care and discussed ten specific strategies to improve self-care. The therapist explained how using a combination of proprioception and gravity can help the patient to move and overcome difficulties with getting out of bed in the morning. The therapist demonstrated how placing one leg off the bed while in supine can provide proprioception feedback and promote movement to further facilitate movement to the edge of the bed and ultimately sitting up in bed. The therapist discussed the importance of gradual exposure, starting with small movements and gradually increasing the intensity of the movements, to help the patient overcome their difficulties. The therapist also discussed the use of other strategies, such as using a morning routine, breaking down tasks into smaller steps, and positive self-talk, to increase motivation and make the task of getting out of bed more manageable.  A: The patient demonstrated understanding of the ten specific strategies discussed to improve self-care and expressed interest in implementing them. The occupational therapist will continue to assess progress in implementing these strategies and  provide additional support as needed in future telehealth sessions. The therapist will also continue to address the impact of depression on self-care and explore any additional barriers that may be hindering the patient's ability to engage in self-care activities.  P: Continue to attend PHP OT group sessions 5x week for 4 weeks to promote daily structure, social engagement, and opportunities to develop and utilize adaptive strategies to maximize functional performance in preparation for safe transition and integration  back into school, work, and the community. Plan to address topic of tbd in next OT group session.                   OT Education - 01/26/22 1701     Education Details Self Care and Depression              OT Short Term Goals - 01/20/22 1827       OT SHORT TERM GOAL #1   Title Pt will be independent w/ implementing effective and healthy coping skills at time of DC    Time 4    Period Weeks    Status New    Target Date 02/19/22      OT SHORT TERM GOAL #2   Title Pt will be independent w/ goal formation and implementation at time of DC    Time 4    Period Weeks    Status New    Target Date 02/19/22      OT SHORT TERM GOAL #3   Title Pt will be independent w/ implementing sustainable routines and habits at time of DC    Time 4    Period Weeks    Status New    Target Date 02/19/22                      Plan - 01/26/22 1702     Psychosocial Skills Coping Strategies;Environmental  Adaptations;Habits;Interpersonal Interaction;Routines and Behaviors             Patient will benefit from skilled therapeutic intervention in order to improve the following deficits and impairments:       Psychosocial Skills: Coping Strategies, Environmental  Adaptations, Habits, Interpersonal Interaction, Routines and Behaviors   Visit Diagnosis: Difficulty coping    Problem List Patient Active Problem List   Diagnosis Date Noted   GAD (generalized anxiety disorder) 01/14/2022   MDD (major depressive disorder), recurrent episode, severe ([place]) 01/14/2022   Allergic rhinitis 01/13/2022   Exercise induced bronchospasm 01/13/2022   Idiopathic urticaria 01/13/2022   Seafood allergy 01/13/2022   Allergy to sulfonamides 01/13/2022   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[place]) 01/13/2022   Alcohol use disorder, moderate, in early remission ([place]) 09/15/2021   Bipolar 1 disorder, mixed ([place]) 03/04/2021   PTSD (post-traumatic stress  disorder) 03/04/2021   Panic disorder 03/04/2021   Alcohol use disorder, moderate, dependence ([place]) 03/04/2021   Cannabis use disorder, mild, abuse 03/04/2021   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

## 2022-01-27 ENCOUNTER — Encounter (HOSPITAL_COMMUNITY): Payer: Self-pay

## 2022-01-27 ENCOUNTER — Encounter (HOSPITAL_COMMUNITY): Payer: Self-pay | Admitting: Psychiatry

## 2022-01-27 ENCOUNTER — Other Ambulatory Visit (HOSPITAL_COMMUNITY): Payer: 59 | Attending: Psychiatry

## 2022-01-27 ENCOUNTER — Other Ambulatory Visit (HOSPITAL_COMMUNITY): Payer: 59 | Attending: Psychiatry | Admitting: Licensed Clinical Social Worker

## 2022-01-27 DIAGNOSIS — F4310 Post-traumatic stress disorder, unspecified: Secondary | ICD-10-CM | POA: Insufficient documentation

## 2022-01-27 DIAGNOSIS — F410 Panic disorder [episodic paroxysmal anxiety] without agoraphobia: Secondary | ICD-10-CM | POA: Insufficient documentation

## 2022-01-27 DIAGNOSIS — F329 Major depressive disorder, single episode, unspecified: Secondary | ICD-10-CM | POA: Insufficient documentation

## 2022-01-27 DIAGNOSIS — Z7389 Other problems related to life management difficulty: Secondary | ICD-10-CM | POA: Insufficient documentation

## 2022-01-27 DIAGNOSIS — F411 Generalized anxiety disorder: Secondary | ICD-10-CM | POA: Insufficient documentation

## 2022-01-27 DIAGNOSIS — R4589 Other symptoms and signs involving emotional state: Secondary | ICD-10-CM | POA: Insufficient documentation

## 2022-01-27 DIAGNOSIS — F332 Major depressive disorder, recurrent severe without psychotic features: Secondary | ICD-10-CM | POA: Diagnosis not present

## 2022-01-27 NOTE — Therapy (Signed)
[place], [place], [address] Phone: [phone]   Fax:  [phone]  Occupational Therapy Treatment Virtual Visit via Video Note  I connected with [name] on 01/27/22 at  8:00 AM EDT by a video enabled telemedicine application and verified that I am speaking with the correct person using two identifiers.  Location: Patient: home Provider: office   I discussed the limitations of evaluation and management by telemedicine and the availability of in person appointments. The patient expressed understanding and agreed to proceed.    The patient was advised to call back or seek an in-person evaluation if the symptoms worsen or if the condition fails to improve as anticipated.  I provided 55 minutes of non-face-to-face time during this encounter.  Patient Details  Name: Anna Woodard MRN: [id number] Date of Birth: [birth year] No data recorded  Encounter Date: 01/27/2022   OT End of Session - 01/27/22 1720     Visit Number 6    Number of Visits 20    Date for OT Re-Evaluation 02/19/22    OT Start Time 38    OT Stop Time 1255    OT Time Calculation (min) 55 min             Past Medical History:  Diagnosis Date   Anxiety and depression    Cancer ([place])    CERBVICAL   CIN III (cervical intraepithelial neoplasia grade III) with severe dysplasia    Complication of anesthesia    WAKES UP IN PANIC / CRYING   Depression    HPV (human papilloma virus) infection    Panic disorder    Panic disorder    PTSD (post-traumatic stress disorder)     Past Surgical History:  Procedure Laterality Date   BARTHOLIN CYST MARSUPIALIZATION N/A 09/09/2017   Procedure: BARTHOLIN CYST MARSUPIALIZATION;  Surgeon: Will Bonnet, MD;  Location: [facility] ORS;  Service: Gynecology;  Laterality: N/A;   CERVICAL BIOPSY  W/ LOOP ELECTRODE EXCISION     DENTAL SURGERY      There were no vitals filed for this  visit.   Subjective Assessment - 01/27/22 1720     Currently in Pain? No/denies    Pain Score 0-No pain               Group Session:  S: doing well today  O: The primary objective of this group therapy session is to equip participants with practical strategies and coping mechanisms to effectively manage accumulated tasks that seem insurmountable due to mental health challenges such as depression and anxiety. The group aims to build a supportive environment wherein individuals can openly discuss their struggles, share experiences, and learn from one another. The session's strategies will encompass goal setting, time management, task breakdown, creating conducive environments, and mindfulness practices. We aim to help participants perceive tasks as manageable units rather than overwhelming piles and encourage an approach of progress over perfection.  A: Patient demonstrated active engagement throughout the session. They contributed valuable insights during discussions, asked relevant questions, and showed enthusiasm towards learning new strategies. Their interaction with other group members was respectful and empathetic, contributing to a supportive group dynamic. Patient appeared to resonate with the strategies of "breaking it down" and "creating a conducive environment," and they shared plans to incorporate these strategies into their daily routine. Their active participation, willingness to share personal experiences, and acceptance of new strategies demonstrate a strong benefit from this therapy session.  P:  Continue to attend PHP OT group sessions 5x week for 4 weeks to promote daily structure, social engagement, and opportunities to develop and utilize adaptive strategies to maximize functional performance in preparation for safe transition and integration back into school, work, and the community. Plan to address topic of pt 2 in next OT group  session.                    OT Education - 01/27/22 1720     Education Details Overcoming Task Accumulation: Strategies for Regaining Control              OT Short Term Goals - 01/20/22 1827       OT SHORT TERM GOAL #1   Title Pt will be independent w/ implementing effective and healthy coping skills at time of DC    Time 4    Period Weeks    Status New    Target Date 02/19/22      OT SHORT TERM GOAL #2   Title Pt will be independent w/ goal formation and implementation at time of DC    Time 4    Period Weeks    Status New    Target Date 02/19/22      OT SHORT TERM GOAL #3   Title Pt will be independent w/ implementing sustainable routines and habits at time of DC    Time 4    Period Weeks    Status New    Target Date 02/19/22                      Plan - 01/27/22 1720     Psychosocial Skills Coping Strategies;Environmental  Adaptations;Habits;Interpersonal Interaction;Routines and Behaviors             Patient will benefit from skilled therapeutic intervention in order to improve the following deficits and impairments:       Psychosocial Skills: Coping Strategies, Environmental  Adaptations, Habits, Interpersonal Interaction, Routines and Behaviors   Visit Diagnosis: Difficulty coping    Problem List Patient Active Problem List   Diagnosis Date Noted   GAD (generalized anxiety disorder) 01/14/2022   MDD (major depressive disorder), recurrent episode, severe ([place]) 01/14/2022   Allergic rhinitis 01/13/2022   Exercise induced bronchospasm 01/13/2022   Idiopathic urticaria 01/13/2022   Seafood allergy 01/13/2022   Allergy to sulfonamides 01/13/2022   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[place]) 01/13/2022   Alcohol use disorder, moderate, in early remission ([place]) 09/15/2021   Bipolar 1 disorder, mixed ([place]) 03/04/2021   PTSD (post-traumatic stress disorder) 03/04/2021   Panic disorder 03/04/2021    Alcohol use disorder, moderate, dependence ([place]) 03/04/2021   Cannabis use disorder, mild, abuse 03/04/2021   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

## 2022-01-29 ENCOUNTER — Encounter (HOSPITAL_COMMUNITY): Payer: Self-pay

## 2022-01-29 ENCOUNTER — Other Ambulatory Visit (HOSPITAL_COMMUNITY): Payer: 59 | Attending: Psychiatry

## 2022-01-29 ENCOUNTER — Other Ambulatory Visit (HOSPITAL_COMMUNITY): Payer: 59 | Attending: Psychiatry | Admitting: Licensed Clinical Social Worker

## 2022-01-29 DIAGNOSIS — F411 Generalized anxiety disorder: Secondary | ICD-10-CM | POA: Insufficient documentation

## 2022-01-29 DIAGNOSIS — R4589 Other symptoms and signs involving emotional state: Secondary | ICD-10-CM

## 2022-01-29 DIAGNOSIS — Z7389 Other problems related to life management difficulty: Secondary | ICD-10-CM | POA: Diagnosis not present

## 2022-01-29 DIAGNOSIS — F332 Major depressive disorder, recurrent severe without psychotic features: Secondary | ICD-10-CM | POA: Insufficient documentation

## 2022-01-29 DIAGNOSIS — F410 Panic disorder [episodic paroxysmal anxiety] without agoraphobia: Secondary | ICD-10-CM

## 2022-01-29 NOTE — Therapy (Signed)
[place], [place], [address] Phone: [phone]   Fax:  [phone]  Occupational Therapy Treatment Virtual Visit via Video Note  I connected with Anna Woodard on 01/29/22 at  8:00 AM EDT by a video enabled telemedicine application and verified that I am speaking with the correct person using two identifiers.  Location: Patient: home Provider: office   I discussed the limitations of evaluation and management by telemedicine and the availability of in person appointments. The patient expressed understanding and agreed to proceed.    The patient was advised to call back or seek an in-person evaluation if the symptoms worsen or if the condition fails to improve as anticipated.  I provided 55 minutes of non-face-to-face time during this encounter.  Patient Details  Name: Anna Woodard MRN: [id number] Date of Birth: May 06, 1991 No data recorded  Encounter Date: 01/29/2022   OT End of Session - 01/29/22 1738     Visit Number 7    Number of Visits 20    Date for OT Re-Evaluation 02/19/22    OT Start Time 57    OT Stop Time 1255    OT Time Calculation (min) 55 min             Past Medical History:  Diagnosis Date   Anxiety and depression    Cancer ([place])    CERBVICAL   CIN III (cervical intraepithelial neoplasia grade III) with severe dysplasia    Complication of anesthesia    WAKES UP IN PANIC / CRYING   Depression    HPV (human papilloma virus) infection    Panic disorder    Panic disorder    PTSD (post-traumatic stress disorder)     Past Surgical History:  Procedure Laterality Date   BARTHOLIN CYST MARSUPIALIZATION N/A 09/09/2017   Procedure: BARTHOLIN CYST MARSUPIALIZATION;  Surgeon: Will Bonnet, MD;  Location: [facility] ORS;  Service: Gynecology;  Laterality: N/A;   CERVICAL BIOPSY  W/ LOOP ELECTRODE EXCISION     DENTAL SURGERY      There were no vitals filed for this  visit.   Subjective Assessment - 01/29/22 1737     Currently in Pain? No/denies    Pain Score 0-No pain               Group Session:  S: Doing better today. Yesterday I didn't feel well, but that has gotten better.   O: The primary objective of this group therapy session is to equip participants with practical strategies and coping mechanisms to effectively manage accumulated tasks that seem insurmountable due to mental health challenges such as depression and anxiety. The group aims to build a supportive environment wherein individuals can openly discuss their struggles, share experiences, and learn from one another. The session's strategies will encompass goal setting, time management, task breakdown, creating conducive environments, and mindfulness practices. We aim to help participants perceive tasks as manageable units rather than overwhelming piles and encourage an approach of progress over perfection.  A: Patient demonstrated active engagement throughout the session. They contributed valuable insights during discussions, asked relevant questions, and showed enthusiasm towards learning new strategies. Their interaction with other group members was respectful and empathetic, contributing to a supportive group dynamic. Patient appeared to resonate with the strategies of "breaking it down" and "creating a conducive environment," and they shared plans to incorporate these strategies into their daily routine. Their active participation, willingness to share personal experiences, and acceptance of new  strategies demonstrate a strong benefit from this therapy session.  P: Continue to attend PHP OT group sessions 5x week for 4 weeks to promote daily structure, social engagement, and opportunities to develop and utilize adaptive strategies to maximize functional performance in preparation for safe transition and integration back into school, work, and the community. Plan to address topic of tbd  in next OT group session.                    OT Education - 01/29/22 1738     Education Details Overcoming Task Accumulation: Strategies for Regaining Control              OT Short Term Goals - 01/20/22 1827       OT SHORT TERM GOAL #1   Title Pt will be independent w/ implementing effective and healthy coping skills at time of DC    Time 4    Period Weeks    Status New    Target Date 02/19/22      OT SHORT TERM GOAL #2   Title Pt will be independent w/ goal formation and implementation at time of DC    Time 4    Period Weeks    Status New    Target Date 02/19/22      OT SHORT TERM GOAL #3   Title Pt will be independent w/ implementing sustainable routines and habits at time of DC    Time 4    Period Weeks    Status New    Target Date 02/19/22                      Plan - 01/29/22 1738     Psychosocial Skills Coping Strategies;Environmental  Adaptations;Habits;Interpersonal Interaction;Routines and Behaviors             Patient will benefit from skilled therapeutic intervention in order to improve the following deficits and impairments:       Psychosocial Skills: Coping Strategies, Environmental  Adaptations, Habits, Interpersonal Interaction, Routines and Behaviors   Visit Diagnosis: Difficulty coping    Problem List Patient Active Problem List   Diagnosis Date Noted   GAD (generalized anxiety disorder) 01/14/2022   MDD (major depressive disorder), recurrent episode, severe ([place]) 01/14/2022   Allergic rhinitis 01/13/2022   Exercise induced bronchospasm 01/13/2022   Idiopathic urticaria 01/13/2022   Seafood allergy 01/13/2022   Allergy to sulfonamides 01/13/2022   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[place]) 01/13/2022   Alcohol use disorder, moderate, in early remission ([place]) 09/15/2021   Bipolar 1 disorder, mixed ([place]) 03/04/2021   PTSD (post-traumatic stress disorder) 03/04/2021   Panic  disorder 03/04/2021   Alcohol use disorder, moderate, dependence ([place]) 03/04/2021   Cannabis use disorder, mild, abuse 03/04/2021   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

## 2022-02-01 ENCOUNTER — Other Ambulatory Visit (HOSPITAL_COMMUNITY): Payer: 59

## 2022-02-01 ENCOUNTER — Ambulatory Visit (HOSPITAL_COMMUNITY): Payer: 59

## 2022-02-02 ENCOUNTER — Other Ambulatory Visit (HOSPITAL_COMMUNITY): Payer: 59 | Attending: Psychiatry

## 2022-02-02 ENCOUNTER — Encounter (HOSPITAL_COMMUNITY): Payer: Self-pay

## 2022-02-02 ENCOUNTER — Other Ambulatory Visit (HOSPITAL_COMMUNITY): Payer: 59 | Attending: Psychiatry | Admitting: Licensed Clinical Social Worker

## 2022-02-02 DIAGNOSIS — F12920 Cannabis use, unspecified with intoxication, uncomplicated: Secondary | ICD-10-CM | POA: Insufficient documentation

## 2022-02-02 DIAGNOSIS — F1994 Other psychoactive substance use, unspecified with psychoactive substance-induced mood disorder: Secondary | ICD-10-CM | POA: Diagnosis not present

## 2022-02-02 DIAGNOSIS — F332 Major depressive disorder, recurrent severe without psychotic features: Secondary | ICD-10-CM | POA: Insufficient documentation

## 2022-02-02 DIAGNOSIS — F411 Generalized anxiety disorder: Secondary | ICD-10-CM | POA: Diagnosis not present

## 2022-02-02 DIAGNOSIS — F410 Panic disorder [episodic paroxysmal anxiety] without agoraphobia: Secondary | ICD-10-CM | POA: Insufficient documentation

## 2022-02-02 DIAGNOSIS — R4589 Other symptoms and signs involving emotional state: Secondary | ICD-10-CM

## 2022-02-02 DIAGNOSIS — F4310 Post-traumatic stress disorder, unspecified: Secondary | ICD-10-CM | POA: Insufficient documentation

## 2022-02-02 NOTE — Progress Notes (Signed)
[facility] MD/PA/NP PHP Progress Note  02/02/2022 11:19 AM Anna Woodard  MRN:  [id number] Virtual Visit via Video Note  I connected with Anna Woodard on 02/02/22 at  9:00 AM EDT by a video enabled telemedicine application and verified that I am speaking with the correct person using two identifiers.  Location: Patient: Home Provider: Clinic   I discussed the limitations of evaluation and management by telemedicine and the availability of in person appointments. The patient expressed understanding and agreed to proceed.   I discussed the assessment and treatment plan with the patient. The patient was provided an opportunity to ask questions and all were answered. The patient agreed with the plan and demonstrated an understanding of the instructions.   The patient was advised to call back or seek an in-person evaluation if the symptoms worsen or if the condition fails to improve as anticipated.  I provided 10 minutes of non-face-to-face time during this encounter.   Armando Reichert, MD  Chief Complaint:  Chief Complaint  Patient presents with   Follow-up   HPI: Patient is a 31 year old female with past psychiatric history of GAD, MDD, substance-induced bipolar related disorder, PTSD, panic disorder, alcohol use disorder in remission, cannabis use-mild presented to [facility] program for follow up.  Patient follows Dr. Shea Evans at [facility].  Pt reports that her mood is "good".  She reports that she is really excited as they finalized their new house and now has a closing date.  She reports that the program has been helping her but sometimes it is difficult to commit as she has a lot of other things to do.  She reports improvement in her depression and anxiety.  She has been sleeping and eating better.  Currently, she denies any suicidal ideations, homicidal ideations, auditory and visual hallucinations. She denies any medication side effects and has been tolerating it well.  She reports that she has just increased  her Effexor to 75 mg recently and will continue at same dose.  She reports no change in her current stressors.  Patient is alert and oriented x 4,  calm, cooperative, and fully engaged in conversation during the encounter.  Her thought process is linear with coherent speech . She does not appear to be responding to internal/external stimuli .     Visit Diagnosis:    ICD-10-CM   1. Severe episode of recurrent major depressive disorder, without psychotic features ([place])  F33.2     2. GAD (generalized anxiety disorder)  F41.1     3. PTSD (post-traumatic stress disorder)  F43.10     4. Panic disorder  F41.0        Past Psychiatric History: See initial assessment  Past Medical History:  Past Medical History:  Diagnosis Date   Anxiety and depression    Cancer ([place])    CERBVICAL   CIN III (cervical intraepithelial neoplasia grade III) with severe dysplasia    Complication of anesthesia    WAKES UP IN PANIC / CRYING   Depression    HPV (human papilloma virus) infection    Panic disorder    Panic disorder    PTSD (post-traumatic stress disorder)     Past Surgical History:  Procedure Laterality Date   BARTHOLIN CYST MARSUPIALIZATION N/A 09/09/2017   Procedure: BARTHOLIN CYST MARSUPIALIZATION;  Surgeon: Will Bonnet, MD;  Location: [facility] ORS;  Service: Gynecology;  Laterality: N/A;   CERVICAL BIOPSY  W/ LOOP ELECTRODE EXCISION     DENTAL SURGERY  Family Psychiatric History: See initial assessment  Family History:  Family History  Problem Relation Age of Onset   Drug abuse Mother    Alcohol abuse Mother    Colon cancer Mother 8   Hypertension Mother    Stroke Mother    Alcohol abuse Father    Mental illness Father    Bipolar disorder Maternal Aunt    Mental illness Paternal Aunt    Mental illness Paternal Uncle    Suicidality Paternal Uncle    Seizures Maternal Grandmother    Breast cancer Other 70   Ovarian cancer Other 24   Suicidality Maternal  Great-grandmother     Social History:  Social History   Socioeconomic History   Marital status: Married    Spouse name: anthony   Number of children: 2   Years of education: Not on file   Highest education level: Associate degree: occupational, Hotel manager, or vocational program  Occupational History   Not on file  Tobacco Use   Smoking status: Former    Packs/day: 0.50    Years: 15.00    Total pack years: 7.50    Types: Cigarettes    Quit date: 04/23/2021    Years since quitting: 0.7   Smokeless tobacco: Never  Vaping Use   Vaping Use: Former  Substance and Sexual Activity   Alcohol use: Not Currently    Comment: RARE   Drug use: Yes    Types: Marijuana   Sexual activity: Yes    Birth control/protection: Surgical    Comment: vasectomy  Other Topics Concern   Not on file  Social History Narrative   Not on file   Social Determinants of Health   Financial Resource Strain: Not on file  Food Insecurity: Not on file  Transportation Needs: Not on file  Physical Activity: Not on file  Stress: Not on file  Social Connections: Not on file    Allergies:  Allergies  Allergen Reactions   Shellfish Allergy Anaphylaxis, Swelling and Other (See Comments)    Eyes swelling, thickening tongue   Zithromax [Azithromycin] Anaphylaxis   Molds & Smuts    Naproxen    Other     See List scanned in to Media.   Penicillins     Metabolic Disorder Labs: Lab Results  Component Value Date   HGBA1C 5.1 03/04/2021   MPG 100 03/04/2021   Lab Results  Component Value Date   PROLACTIN 5.1 03/04/2021   Lab Results  Component Value Date   CHOL 127 03/04/2021   TRIG 33 03/04/2021   HDL 46 03/04/2021   CHOLHDL 2.8 03/04/2021   VLDL 7 03/04/2021   LDLCALC 74 03/04/2021   Lab Results  Component Value Date   TSH 0.508 03/04/2021    Therapeutic Level Labs: No results found for: "LITHIUM" No results found for: "VALPROATE" No results found for: "CBMZ"  Current  Medications: Current Outpatient Medications  Medication Sig Dispense Refill   ADVAIR DISKUS 250-50 MCG/DOSE AEPB Inhale 1 puff into the lungs 2 (two) times daily.     EPINEPHrine 0.3 mg/0.3 mL IJ SOAJ injection Inject 0.3 mg into the muscle as needed.     famotidine (PEPCID) 40 MG tablet Take 1 tablet by mouth daily.     hydrOXYzine (VISTARIL) 25 MG capsule TAKE 1 CAPSULE(25 MG) BY MOUTH TWICE DAILY AS NEEDED FOR ANXIETY OR SEVERE ANXIETY 180 capsule 0   PARoxetine (PAXIL) 20 MG tablet TAKE 1 TABLET(20 MG) BY MOUTH DAILY WITH BREAKFAST 90 tablet 0  prazosin (MINIPRESS) 1 MG capsule TAKE 1 CAPSULE(1 MG) BY MOUTH AT BEDTIME FOR NIGHTMARES 90 capsule 0   QUEtiapine (SEROQUEL) 100 MG tablet Take 1.5 tablets (150 mg total) by mouth at bedtime. Dose change 45 tablet 1   venlafaxine XR (EFFEXOR XR) 37.5 MG 24 hr capsule Take 1 capsule (37.5 mg total) by mouth daily. 30 capsule 0   No current facility-administered medications for this visit.    Musculoskeletal: Strength & Muscle Tone: Not able to assess due to virtual visit Gait & Station: Not able to assess due to virtual visit Patient leans: N/A   Psychiatric Specialty Exam: Review of Systems  Last menstrual period 12/21/2021, not currently breastfeeding.There is no height or weight on file to calculate BMI.  General Appearance: Casual  Eye Contact:  Good  Speech:  Clear and Coherent and Normal Rate  Volume:  Normal  Mood: Good  Affect: Full range  Thought Process:  Coherent and Linear  Orientation:  Full (Time, Place, and Person)  Thought Content:  Paranoid Ideation  Suicidal Thoughts:  No  Homicidal Thoughts:  No  Memory:  Immediate;   Good Recent;   Good  Judgement:  Fair  Insight:  Good  Psychomotor Activity:  Normal  Concentration:  Concentration: Fair and Attention Span: Fair  Recall:  Good  Fund of Knowledge:Good  Language: Good  Akathisia:  No  Handed:  Right  AIMS (if indicated):  not done  Assets:  Communication  Skills Desire for Improvement Financial Resources/Insurance Housing Resilience Social Support Talents/Skills  ADL's:  Intact  Cognition: WNL  Sleep: Good  Screenings: [place] Office Visit from 01/13/2022 in [place] Video Visit from 09/15/2021 in [place] Total Score 0 0      AUDIT    [place] Visit from 03/04/2021 in [place]  Alcohol Use Disorder Identification Test Final Score (AUDIT) 22      GAD-7    Flowsheet Row Counselor from 01/14/2022 in [place] Visit from 01/13/2022 in [place] Video Visit from 09/15/2021 in [place] Office Visit from 04/28/2021 in [place] Office Visit from 03/04/2021 in [place]  Total GAD-7 Score '18 18 1 14 21      '$ PHQ2-9    Flowsheet Row Counselor from 01/25/2022 in [place] Counselor from 01/14/2022 in [place] Office Visit from 01/13/2022 in [place] Video Visit from 09/15/2021 in [place] Video Visit from 06/12/2021 in [place]  PHQ-2 Total Score '4 6 6 '$ 0 2  PHQ-9 Total Score '13 22 22 1 8      '$ Flowsheet Row Counselor from 01/25/2022 in [place] Counselor from 01/14/2022 in [place] Office Visit from 01/13/2022 in [place] Error: Question 6 not populated Error: Q3, 4, or 5 should not be populated when Q2 is No No Risk        Assessment and Plan: Patient is a 31 year old female with past psychiatric history of GAD, MDD, substance-induced bipolar related  disorder, PTSD, panic disorder, alcohol use disorder in remission, cannabis use-mild presented to [facility] program for worsening depression and anxiety.  Patient follows Dr. Shea Evans at [facility].   Patient's depression and anxiety has been improving.  Paxil weaned and stopped.  Patient has been tolerating Effexor well.   GAD MDD, recurrent, severe with psychotic features PTSD panic disorder   -Continue Seroquel 150 mg nightly for mood stabilization, depression and insomnia -Paxil stopped (ineffective) -Continue Effexor XR 75 mg daily for depression and anxiety. -Continue prazosin 1 mg nightly for nightmares -Continue hydroxyzine 25 mg twice daily as needed for anxiety. -Recommend starting EMDR therapy.   Follow-up next week   Collaboration of Care: and Other PHP team   Patient/Guardian was advised Release of Information must be obtained prior to any record release in order to collaborate their care with an outside provider. Patient/Guardian was advised if they have not already done so to contact the registration department to sign all necessary forms in order for [place] to release information regarding their care.    Consent: Patient/Guardian gives verbal consent for treatment and assignment of benefits for services provided during this visit. Patient/Guardian expressed understanding and agreed to proceed.     Armando Reichert, MD 02/02/2022, 11:19 AM

## 2022-02-02 NOTE — Therapy (Signed)
[place], [place], [address] Phone: [phone]   Fax:  [phone]  Occupational Therapy Treatment Virtual Visit via Video Note  I connected with [name] on 02/02/22 at  8:00 AM EDT by a video enabled telemedicine application and verified that I am speaking with the correct person using two identifiers.  Location: Patient: home Provider: office   I discussed the limitations of evaluation and management by telemedicine and the availability of in person appointments. The patient expressed understanding and agreed to proceed.    The patient was advised to call back or seek an in-person evaluation if the symptoms worsen or if the condition fails to improve as anticipated.  I provided 55 minutes of non-face-to-face time during this encounter.   Patient Details  Name: Anna Woodard MRN: [id number] Date of Birth: 1990/05/15 No data recorded  Encounter Date: 02/02/2022   OT End of Session - 02/02/22 1854     Visit Number 8    Number of Visits 20    Date for OT Re-Evaluation 02/19/22    OT Start Time 1200    OT Stop Time 1255    OT Time Calculation (min) 55 min             Past Medical History:  Diagnosis Date   Anxiety and depression    Cancer ([place])    CERBVICAL   CIN III (cervical intraepithelial neoplasia grade III) with severe dysplasia    Complication of anesthesia    WAKES UP IN PANIC / CRYING   Depression    HPV (human papilloma virus) infection    Panic disorder    Panic disorder    PTSD (post-traumatic stress disorder)     Past Surgical History:  Procedure Laterality Date   BARTHOLIN CYST MARSUPIALIZATION N/A 09/09/2017   Procedure: BARTHOLIN CYST MARSUPIALIZATION;  Surgeon: Will Bonnet, MD;  Location: [facility] ORS;  Service: Gynecology;  Laterality: N/A;   CERVICAL BIOPSY  W/ LOOP ELECTRODE EXCISION     DENTAL SURGERY      There were no vitals filed for this  visit.   Subjective Assessment - 02/02/22 1852     Currently in Pain? No/denies    Pain Score 0-No pain               Group Session:  S: Doing well today. Some stress about closing on the house but overall okay"  O: In today's OT group therapy session, the therapist introduced the concept of [place] of Needs as a tool to help patients understand their unique path towards mental well-being. This psychological theory is comprised of five tiers, representing essential human needs, from physiological needs at the base to self-actualization at the peak. The therapist explained that recognizing and fulfilling the lower needs, such as food, shelter, and safety, are foundational steps that enable individuals to progressively work on higher needs, such as relationships, self-esteem, and personal growth. By understanding this hierarchy, patients were encouraged to identify areas in their lives where needs might be unmet or neglected, potentially leading to emotional and psychological issues. Within the therapy session, the conversation around [employer] prompted insightful reflections and discussions among the patients. The therapist guided the group in exploring how they could use this framework as a roadmap to identify and address personal challenges that might be hindering their mental health progress. By acknowledging and working on satisfying unmet needs, from the most basic to more complex emotional and  personal growth needs, the group came to see how they could move closer to achieving a more balanced and fulfilled life. This approach not only provided clarity but also empowered them with a constructive method to monitor and enhance their mental well-being.  A: The patient was highly engaged throughout the session, showing a keen interest in the concept of [place] of Needs. They actively participated in the discussion, sharing personal experiences and asking questions  to gain a deeper understanding. The patient was able to identify specific unmet needs that could be contributing to their symptoms of depression and anxiety. This level of engagement and self-awareness suggests a readiness for therapeutic interventions that target specific areas of unmet need. The patient appeared motivated to address these needs as a pathway to improve their mental health.  P: Continue to attend PHP OT group sessions 5x week for 4 weeks to promote daily structure, social engagement, and opportunities to develop and utilize adaptive strategies to maximize functional performance in preparation for safe transition and integration back into school, work, and the community. Plan to address topic of tbd in next OT group session.                    OT Education - 02/02/22 1852     Education Details Hierarchy of Needs              OT Short Term Goals - 01/20/22 1827       OT SHORT TERM GOAL #1   Title Pt will be independent w/ implementing effective and healthy coping skills at time of DC    Time 4    Period Weeks    Status New    Target Date 02/19/22      OT SHORT TERM GOAL #2   Title Pt will be independent w/ goal formation and implementation at time of DC    Time 4    Period Weeks    Status New    Target Date 02/19/22      OT SHORT TERM GOAL #3   Title Pt will be independent w/ implementing sustainable routines and habits at time of DC    Time 4    Period Weeks    Status New    Target Date 02/19/22                      Plan - 02/02/22 1854     Psychosocial Skills Coping Strategies;Environmental  Adaptations;Habits;Interpersonal Interaction;Routines and Behaviors             Patient will benefit from skilled therapeutic intervention in order to improve the following deficits and impairments:       Psychosocial Skills: Coping Strategies, Environmental  Adaptations, Habits, Interpersonal Interaction, Routines and  Behaviors   Visit Diagnosis: Difficulty coping    Problem List Patient Active Problem List   Diagnosis Date Noted   GAD (generalized anxiety disorder) 01/14/2022   MDD (major depressive disorder), recurrent episode, severe ([place]) 01/14/2022   Allergic rhinitis 01/13/2022   Exercise induced bronchospasm 01/13/2022   Idiopathic urticaria 01/13/2022   Seafood allergy 01/13/2022   Allergy to sulfonamides 01/13/2022   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[place]) 01/13/2022   Alcohol use disorder, moderate, in early remission ([place]) 09/15/2021   Bipolar 1 disorder, mixed ([place]) 03/04/2021   PTSD (post-traumatic stress disorder) 03/04/2021   Panic disorder 03/04/2021   Alcohol use disorder, moderate, dependence ([place]) 03/04/2021   Cannabis use disorder, mild,  abuse 03/04/2021   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

## 2022-02-03 ENCOUNTER — Ambulatory Visit (HOSPITAL_COMMUNITY): Payer: 59

## 2022-02-03 ENCOUNTER — Other Ambulatory Visit (HOSPITAL_COMMUNITY): Payer: 59

## 2022-02-04 ENCOUNTER — Other Ambulatory Visit (HOSPITAL_COMMUNITY): Payer: 59

## 2022-02-04 ENCOUNTER — Ambulatory Visit (HOSPITAL_COMMUNITY): Payer: 59

## 2022-02-09 ENCOUNTER — Encounter (HOSPITAL_COMMUNITY): Payer: Self-pay | Admitting: Psychiatry

## 2022-02-09 ENCOUNTER — Telehealth: Payer: Self-pay | Admitting: Psychiatry

## 2022-02-09 NOTE — Telephone Encounter (Signed)
I am attaching Soleia CMA to this conversation.  Alena could possibly give her some resources in the community.  Also patient could talk to her primary care provider who will be able to refer her to new providers at her new location.

## 2022-02-09 NOTE — Telephone Encounter (Signed)
Patient stated that she will be moved to [facility] by November 1. Changed her appt to virtual on that date. Patient asks if you have recommendation for a program or psychiatrist in the [facility] area. So she can contact them for appointments. Please advise

## 2022-02-12 NOTE — Telephone Encounter (Signed)
Mailed info

## 2022-03-10 ENCOUNTER — Telehealth (INDEPENDENT_AMBULATORY_CARE_PROVIDER_SITE_OTHER): Payer: 59 | Admitting: Psychiatry

## 2022-03-10 DIAGNOSIS — F332 Major depressive disorder, recurrent severe without psychotic features: Secondary | ICD-10-CM

## 2022-03-10 NOTE — Progress Notes (Signed)
Patient did not connect at the time of appointment.  Hence contacted her by phone.  Patient reports she is currently moving to a new home and is busy.  She does not have Internet connection and cannot connect by video.  She was offered a phone call for this visit.  Patient however reports she is driving and is unable to park her car and also getting late to pick up her son from school.  Patient advised to call the clinic back to reschedule as needed.

## 2022-03-23 ENCOUNTER — Ambulatory Visit: Payer: 59 | Admitting: Psychology

## 2022-03-24 ENCOUNTER — Ambulatory Visit: Payer: 59 | Admitting: Psychology

## 2022-04-26 NOTE — Psych (Signed)
Virtual Visit via Video Note  I connected with Anna Woodard on 01/27/22 at  9:00 AM EDT by a video enabled telemedicine application and verified that I am speaking with the correct person using two identifiers.  Location: Patient: pt's home Provider: clinical home office   I discussed the limitations of evaluation and management by telemedicine and the availability of in person appointments. The patient expressed understanding and agreed to proceed.   I discussed the assessment and treatment plan with the patient. The patient was provided an opportunity to ask questions and all were answered. The patient agreed with the plan and demonstrated an understanding of the instructions.   The patient was advised to call back or seek an in-person evaluation if the symptoms worsen or if the condition fails to improve as anticipated.  Pt was provided 240 minutes of non-face-to-face time during this encounter.   Lorin Glass, LCSW   [facility] PHP THERAPIST PROGRESS NOTE  Anna Woodard [id number]   Session Time: 9:00 am - 10:00 am  Participation Level: Active  Behavioral Response: CasualAlertAnxious and Depressed  Type of Therapy: Group Therapy  Treatment Goals addressed: Coping  Progress Towards Goals: Progressing minimally  Interventions: CBT, DBT, Solution Focused, Strength-based, Supportive, and Reframing  Therapist Response: Clinician led check-in regarding current stressors and situation, and review of patient completed daily inventory. Clinician utilized active listening and empathetic response and validated patient emotions. Clinician facilitated processing group on pertinent issues.?   Summary: Patient arrived within time allowed. Patient rates her mood at a 6 on a scale of 1-10 with 10 being best. Pt reports she is tired and frustrated. Pt reports sleeping poorly due to nightmares. Pt state she was "unproductive" after group due to exhaustion. Pt reports having "non-stop panic  attacks" and having them during group as well however there is no observable indication of that. Pt able to process.?Pt engaged in discussion.?       Session Time: 10:00 am - 11:00 am  Participation Level: Active  Behavioral Response: CasualAlertAnxious, Depressed, and labile  Type of Therapy: Group Therapy  Treatment Goals addressed: Coping  Progress Towards Goals: Progressing   Interventions: CBT, DBT, Solution Focused, Strength-based, Supportive, and Reframing  Therapist Response: Cln led discussion on the 5 stages of grief and the way loss affects [place]. Cln encouraged pt to consider grief as a journey to accepting a new future. Cln created space for pt to process grief concerns and validated pt's experiences.     Therapist Response: Pt engaged in discussion and was able to identify areas of loss and process.          Session Time: 11:00 -12:00   Participation Level: Active   Behavioral Response: CasualAlertDepressed   Type of Therapy: Group Therapy, Spiritual Care   Treatment Goals addressed: Coping   Progress Towards Goals: Progressing   Interventions: Supportive, Education   Summary:  Anna Woodard, Chaplain, led group.   Therapist Response: Pt participated    Session Time: 12:00 pm - 1:00 pm  Participation Level: Active  Behavioral Response: CasualAlertAnxious and Depressed  Type of Therapy: Group Therapy  Treatment Goals addressed: Coping  Progress Towards Goals: Progressing   Interventions: CBT, DBT, Solution Focused, Strength-based, Supportive, and Reframing  Therapist Response: 12:00 - 12:50 pm: Occupational therapy group w/cln E Hollan 12:50 - 1:00 pm: Clinician led check-out. Clinician assessed for immediate needs, medication compliance and efficacy, and safety concerns?  Summary: 12:00 - 12:50 pm: See OT note  12:50 - 1:00 pm: At  check-out, patient contracts for safety.? Patient demonstrates some progress as evidenced by her continued  engagement and by being receptive to treatment. Patient denies SI/HI/self-harm thoughts at the end of group and agrees to seek help should those thoughts/feelings occur.?   Suicidal/Homicidal: Nowithout intent/plan  Plan: ?Pt will continue in PHP and medication management while continuing to work on decreasing depression symptoms,?SI, and anxiety symptoms,?and increasing the ability to self manage symptoms.    Collaboration of Care: Medication Management AEB Dr. Rosita Kea  Patient/Guardian was advised Release of Information must be obtained prior to any record release in order to collaborate their care with an outside provider. Patient/Guardian was advised if they have not already done so to contact the registration department to sign all necessary forms in order for [place] to release information regarding their care.   Consent: Patient/Guardian gives verbal consent for treatment and assignment of benefits for services provided during this visit. Patient/Guardian expressed understanding and agreed to proceed.   Diagnosis: Severe episode of recurrent major depressive disorder, without psychotic features ([place]) [F33.2]    1. Severe episode of recurrent major depressive disorder, without psychotic features ([place])   2. Panic disorder       Lorin Glass, LCSW

## 2022-04-26 NOTE — Psych (Signed)
Virtual Visit via Video Note  I connected with Anna Woodard on 02/02/22 at  9:00 AM EDT by a video enabled telemedicine application and verified that I am speaking with the correct person using two identifiers.  Location: Patient: pt's home Provider: clinical home office   I discussed the limitations of evaluation and management by telemedicine and the availability of in person appointments. The patient expressed understanding and agreed to proceed.   I discussed the assessment and treatment plan with the patient. The patient was provided an opportunity to ask questions and all were answered. The patient agreed with the plan and demonstrated an understanding of the instructions.   The patient was advised to call back or seek an in-person evaluation if the symptoms worsen or if the condition fails to improve as anticipated.  Pt was provided 240 minutes of non-face-to-face time during this encounter.   Lorin Glass, LCSW   [facility] PHP THERAPIST PROGRESS NOTE  Anna Woodard [id number]   Session Time: 9:00 am - 10:00 am  Participation Level: Active  Behavioral Response: CasualAlertAnxious and Depressed  Type of Therapy: Group Therapy  Treatment Goals addressed: Coping  Progress Towards Goals: Progressing   Interventions: CBT, DBT, Solution Focused, Strength-based, Supportive, and Reframing  Therapist Response: Clinician led check-in regarding current stressors and situation, and review of patient completed daily inventory. Clinician utilized active listening and empathetic response and validated patient emotions. Clinician facilitated processing group on pertinent issues.?   Summary: Patient arrived within time allowed. Patient rates her mood at a 8 on a scale of 1-10 with 10 being best. Pt reports feeling "good." Pt states she is sleeping better and eating regularly. Pt report shaving a successful difficult conversation with her mother.Pt able to process.?Pt engaged in  discussion.?       Session Time: 10:00 am - 11:00 am  Participation Level: Active  Behavioral Response: CasualAlertAnxious, Depressed, and labile  Type of Therapy: Group Therapy  Treatment Goals addressed: Coping  Progress Towards Goals: Progressing   Interventions: CBT, DBT, Solution Focused, Strength-based, Supportive, and Reframing  Therapist Response:  Cln introduced topic of stress management and the model of the "4 A's of stress management:" avoid, alter, accept, and adapt. Group members worked through Advice worker and discussed barriers to utilizing the 4 A's for stressors.    Therapist Response:  Pt engaged in discussion and reports understanding of how to utilize the 4 A's.          Session Time: 11:00 -12:00   Participation Level: Active   Behavioral Response: CasualAlertDepressed   Type of Therapy: Group Therapy   Treatment Goals addressed: Coping   Progress Towards Goals: Progressing   Interventions: CBT, DBT, Solution Focused, Strength-based, Supportive, and Reframing   Summary: Cln continued topic of DBT distress tolerance skills and the ACCEPTS distraction skill. Group reviewed C-C-E skills and discussed how they can practice them in their every day life.    Therapist Response: Pt engaged in discussion and is able to state ways to apply these skills.       Session Time: 12:00 pm - 1:00 pm  Participation Level: Active  Behavioral Response: CasualAlertAnxious and Depressed  Type of Therapy: Group Therapy  Treatment Goals addressed: Coping  Progress Towards Goals: Progressing   Interventions: CBT, DBT, Solution Focused, Strength-based, Supportive, and Reframing  Therapist Response: 12:00 - 12:50 pm: Occupational therapy group w/cln E Hollan 12:50 - 1:00 pm: Clinician led check-out. Clinician assessed for immediate needs, medication compliance and efficacy, and safety concerns?  Summary: 12:00 - 12:50 pm: See OT note  12:50 - 1:00 pm: At  check-out, patient contracts for safety.? Patient demonstrates some progress as evidenced by her continued engagement and by being receptive to treatment. Patient denies SI/HI/self-harm thoughts at the end of group and agrees to seek help should those thoughts/feelings occur.?   Suicidal/Homicidal: Nowithout intent/plan  Plan: ?Pt will discharge from [facility] per her request, stating she is ready for outpatient therapy. Provider aligns with pt's request and desire to discharge to previous provider. Pt denies SI/HI at time of discharge.    Collaboration of Care: Medication Management AEB Dr. Rosita Kea  Patient/Guardian was advised Release of Information must be obtained prior to any record release in order to collaborate their care with an outside provider. Patient/Guardian was advised if they have not already done so to contact the registration department to sign all necessary forms in order for [place] to release information regarding their care.   Consent: Patient/Guardian gives verbal consent for treatment and assignment of benefits for services provided during this visit. Patient/Guardian expressed understanding and agreed to proceed.   Diagnosis: Severe episode of recurrent major depressive disorder, without psychotic features ([place]) [F33.2]    1. Severe episode of recurrent major depressive disorder, without psychotic features ([place])   2. GAD (generalized anxiety disorder)   3. PTSD (post-traumatic stress disorder)   4. Panic disorder       Lorin Glass, LCSW

## 2022-04-26 NOTE — Psych (Signed)
Virtual Visit via Video Note  I connected with [name] on 01/26/22 at  9:00 AM EDT by a video enabled telemedicine application and verified that I am speaking with the correct person using two identifiers.  Location: Patient: pt's home Provider: clinical home office   I discussed the limitations of evaluation and management by telemedicine and the availability of in person appointments. The patient expressed understanding and agreed to proceed.   I discussed the assessment and treatment plan with the patient. The patient was provided an opportunity to ask questions and all were answered. The patient agreed with the plan and demonstrated an understanding of the instructions.   The patient was advised to call back or seek an in-person evaluation if the symptoms worsen or if the condition fails to improve as anticipated.  Pt was provided 240 minutes of non-face-to-face time during this encounter.   Lorin Glass, LCSW   [facility] PHP THERAPIST PROGRESS NOTE  Corby Villasenor [id number]   Session Time: 9:00 am - 10:00 am  Participation Level: Active  Behavioral Response: CasualAlertAnxious and Depressed  Type of Therapy: Group Therapy  Treatment Goals addressed: Coping  Progress Towards Goals: Progressing minimally  Interventions: CBT, DBT, Solution Focused, Strength-based, Supportive, and Reframing  Therapist Response: Clinician led check-in regarding current stressors and situation, and review of patient completed daily inventory. Clinician utilized active listening and empathetic response and validated patient emotions. Clinician facilitated processing group on pertinent issues.?   Summary: Patient arrived within time allowed. Patient rates her mood at a 7 on a scale of 1-10 with 10 being best. Pt reports they are moving forward in buying a house and pt is excited. Pt reports she is anxious about the difficulty of moving with kids. Pt states she slept approximately 6 hours last  night.  Pt able to process.?Pt engaged in discussion.?       Session Time: 10:00 am - 11:00 am  Participation Level: Active  Behavioral Response: CasualAlertAnxious, Depressed, and labile  Type of Therapy: Group Therapy  Treatment Goals addressed: Coping  Progress Towards Goals: Progressing   Interventions: CBT, DBT, Solution Focused, Strength-based, Supportive, and Reframing  Therapist Response: Cln introduced DBT emotion regulation skill, PLEASE. Cln discussed the importance of taking care of our whole body as a way to aid in stabilizing emotions. Group discussed ways they struggle to manage the elements of PLEASE and how to remove barriers.    Therapist Response: Pt engaged in discussion and is able to identify ways to utilize PLEASE skill.        Session Time: 11:00 -12:00   Participation Level: Active   Behavioral Response: CasualAlertDepressed   Type of Therapy: Group Therapy   Treatment Goals addressed: Coping   Progress Towards Goals: Progressing   Interventions: CBT, DBT, Solution Focused, Strength-based, Supportive, and Reframing   Summary: Cln led discussion on making difficult decisions. Group members discussed decisions they are struggling with. Cln utilized CBT thought challenging and DBT walking the middle path to inform discussion.   Therapist Response:  Pt engaged in discussion    Session Time: 12:00 pm - 1:00 pm  Participation Level: Active  Behavioral Response: CasualAlertAnxious and Depressed  Type of Therapy: Group Therapy  Treatment Goals addressed: Coping  Progress Towards Goals: Progressing   Interventions: CBT, DBT, Solution Focused, Strength-based, Supportive, and Reframing  Therapist Response: 12:00 - 12:50 pm: Occupational therapy group w/cln E Hollan 12:50 - 1:00 pm: Clinician led check-out. Clinician assessed for immediate needs, medication compliance and efficacy,  and safety concerns?  Summary: 12:00 - 12:50 pm: See OT note   12:50 - 1:00 pm: At check-out, patient contracts for safety.? Patient demonstrates some progress as evidenced by her continued engagement and by being receptive to treatment. Patient denies SI/HI/self-harm thoughts at the end of group and agrees to seek help should those thoughts/feelings occur.?   Suicidal/Homicidal: Nowithout intent/plan  Plan: ?Pt will continue in PHP and medication management while continuing to work on decreasing depression symptoms,?SI, and anxiety symptoms,?and increasing the ability to self manage symptoms.    Collaboration of Care: Medication Management AEB Dr. Rosita Kea  Patient/Guardian was advised Release of Information must be obtained prior to any record release in order to collaborate their care with an outside provider. Patient/Guardian was advised if they have not already done so to contact the registration department to sign all necessary forms in order for [place] to release information regarding their care.   Consent: Patient/Guardian gives verbal consent for treatment and assignment of benefits for services provided during this visit. Patient/Guardian expressed understanding and agreed to proceed.   Diagnosis: Severe episode of recurrent major depressive disorder, without psychotic features ([place]) [F33.2]    1. Severe episode of recurrent major depressive disorder, without psychotic features ([place])   2. Panic disorder   3. GAD (generalized anxiety disorder)   4. PTSD (post-traumatic stress disorder)       Lorin Glass, LCSW

## 2022-04-26 NOTE — Psych (Signed)
Virtual Visit via Video Note  I connected with Anna Woodard on 01/25/22 at  9:00 AM EDT by a video enabled telemedicine application and verified that I am speaking with the correct person using two identifiers.  Location: Patient: pt's home Provider: clinical home office   I discussed the limitations of evaluation and management by telemedicine and the availability of in person appointments. The patient expressed understanding and agreed to proceed.   I discussed the assessment and treatment plan with the patient. The patient was provided an opportunity to ask questions and all were answered. The patient agreed with the plan and demonstrated an understanding of the instructions.   The patient was advised to call back or seek an in-person evaluation if the symptoms worsen or if the condition fails to improve as anticipated.  Pt was provided 240 minutes of non-face-to-face time during this encounter.   Lorin Glass, LCSW   [facility] PHP THERAPIST PROGRESS NOTE  Anna Woodard [id number]   Session Time: 9:00 am - 10:00 am  Participation Level: Active  Behavioral Response: CasualAlertAnxious and Depressed  Type of Therapy: Group Therapy  Treatment Goals addressed: Coping  Progress Towards Goals: Progressing minimally  Interventions: CBT, DBT, Solution Focused, Strength-based, Supportive, and Reframing  Therapist Response: Clinician led check-in regarding current stressors and situation, and review of patient completed daily inventory. Clinician utilized active listening and empathetic response and validated patient emotions. Clinician facilitated processing group on pertinent issues.?   Summary: Patient arrived within time allowed. Patient rates her mood at a 5 on a scale of 1-10 with 10 being best. Pt reports she feels "really irritable and anxious" and that she is  "living in a panic state." Pt denies any known trigger and states she "gets through it" and is unable to elaborate.  Pt states she only slept 5-10 min at a time. Pt able to process.?Pt engaged in discussion.?       Session Time: 10:00 am - 11:00 am  Participation Level: Active  Behavioral Response: CasualAlertAnxious, Depressed, and labile  Type of Therapy: Group Therapy  Treatment Goals addressed: Coping  Progress Towards Goals: Progressing   Interventions: CBT, DBT, Solution Focused, Strength-based, Supportive, and Reframing  Therapist Response: Cln led discussion on feelings and the role they play for our lives. Cln contextualized feelings as a warning system that brings attention to areas of our lives that need focus. Group members discussed how to pay attention to what the feeling is telling us versus reacting to unpleasant aspects of a feeling.    Therapist Response: Pt engaged in discussion and reports understanding.         Session Time: 11:00 -12:00   Participation Level: Active   Behavioral Response: CasualAlertDepressed   Type of Therapy: Group Therapy   Treatment Goals addressed: Coping   Progress Towards Goals: Progressing   Interventions: CBT, DBT, Solution Focused, Strength-based, Supportive, and Reframing   Summary: Cln introduced DBT distress tolerance skill IMPROVE.  Cln discussed how this set of skills are for when you have to sit through an undesirable feeling and wait for it to pass. Group discussed how to apply the IMPROVE skills to decrease distress at the undesired feeling.    Therapist Response:  Pt engaged in discussion and is able to identify ways to apply the skill.    Session Time: 12:00 pm - 1:00 pm  Participation Level: Active  Behavioral Response: CasualAlertAnxious and Depressed  Type of Therapy: Group Therapy  Treatment Goals addressed: Coping  Progress Towards  Goals: Progressing   Interventions: CBT, DBT, Solution Focused, Strength-based, Supportive, and Reframing  Therapist Response: 12:00 - 12:50 pm: Occupational therapy group w/cln E  Hollan 12:50 - 1:00 pm: Clinician led check-out. Clinician assessed for immediate needs, medication compliance and efficacy, and safety concerns?  Summary: 12:00 - 12:50 pm: See OT note  12:50 - 1:00 pm: At check-out, patient contracts for safety.? Patient demonstrates some progress as evidenced by her continued engagement and by being receptive to treatment. Patient denies SI/HI/self-harm thoughts at the end of group and agrees to seek help should those thoughts/feelings occur.?   Suicidal/Homicidal: Nowithout intent/plan  Plan: ?Pt will continue in PHP and medication management while continuing to work on decreasing depression symptoms,?SI, and anxiety symptoms,?and increasing the ability to self manage symptoms.    Collaboration of Care: Medication Management AEB Dr. Rosita Kea  Patient/Guardian was advised Release of Information must be obtained prior to any record release in order to collaborate their care with an outside provider. Patient/Guardian was advised if they have not already done so to contact the registration department to sign all necessary forms in order for [place] to release information regarding their care.   Consent: Patient/Guardian gives verbal consent for treatment and assignment of benefits for services provided during this visit. Patient/Guardian expressed understanding and agreed to proceed.   Diagnosis: Severe episode of recurrent major depressive disorder, without psychotic features ([place]) [F33.2]    1. Severe episode of recurrent major depressive disorder, without psychotic features ([place])   2. Panic disorder   3. GAD (generalized anxiety disorder)   4. PTSD (post-traumatic stress disorder)       Lorin Glass, LCSW

## 2022-04-26 NOTE — Psych (Signed)
Virtual Visit via Video Note  I connected with Anna Woodard on 01/29/22 at  9:00 AM EDT by a video enabled telemedicine application and verified that I am speaking with the correct person using two identifiers.  Location: Patient: pt's home Provider: clinical home office   I discussed the limitations of evaluation and management by telemedicine and the availability of in person appointments. The patient expressed understanding and agreed to proceed.   I discussed the assessment and treatment plan with the patient. The patient was provided an opportunity to ask questions and all were answered. The patient agreed with the plan and demonstrated an understanding of the instructions.   The patient was advised to call back or seek an in-person evaluation if the symptoms worsen or if the condition fails to improve as anticipated.  Pt was provided 240 minutes of non-face-to-face time during this encounter.   Lorin Glass, LCSW   [facility] PHP THERAPIST PROGRESS NOTE  Anna Woodard [id number]   Session Time: 9:00 am - 10:00 am  Participation Level: Active  Behavioral Response: CasualAlertAnxious and Depressed  Type of Therapy: Group Therapy  Treatment Goals addressed: Coping  Progress Towards Goals: Progressing  Interventions: CBT, DBT, Solution Focused, Strength-based, Supportive, and Reframing  Therapist Response: Clinician led check-in regarding current stressors and situation, and review of patient completed daily inventory. Clinician utilized active listening and empathetic response and validated patient emotions. Clinician facilitated processing group on pertinent issues.?   Summary: Patient arrived within time allowed. Patient rates her mood at a 8 on a scale of 1-10 with 10 being best. Pt reports she woke up in a good mood. Pt reports yesterday she had an allergic reaction which kept her in bed. Pt reports she slept well the last 2 nights and is eating well. Pt able to  process.?Pt engaged in discussion.?       Session Time: 10:00 am - 11:00 am  Participation Level: Active  Behavioral Response: CasualAlertAnxious, Depressed, and labile  Type of Therapy: Group Therapy  Treatment Goals addressed: Coping  Progress Towards Goals: Progressing   Interventions: CBT, DBT, Solution Focused, Strength-based, Supportive, and Reframing  Therapist Response: Cln led discussion on negative self-talk and how it affects [place]. Cln utilized CBT to discuss how thoughts shape our feelings and actions. Group members shared how negative thinking affects them and worked to reframe their negative thinking.    Therapist Response: Pt engaged in discussion and reports understanding.        Session Time: 11:00 -12:00   Participation Level: Active   Behavioral Response: CasualAlertDepressed   Type of Therapy: Group Therapy   Treatment Goals addressed: Coping   Progress Towards Goals: Progressing   Interventions: CBT, DBT, Solution Focused, Strength-based, Supportive, and Reframing   Summary: Cln led discussion on communication struggles. Group shared ways in which communication is difficult for them and what are typical barriers. Cln reminded pt's of assertiveness skills and "I" statements. Cln encouraged pt's to consider making notes for points they want to address in a discussion and/or script out what they want to say as a way to decrease anxiety and likeliness that they will get off topic.    Therapist Response:  Pt engaged in discussion and reports willingness to apply communication strategies.    Session Time: 12:00 pm - 1:00 pm  Participation Level: Active  Behavioral Response: CasualAlertAnxious and Depressed  Type of Therapy: Group Therapy  Treatment Goals addressed: Coping  Progress Towards Goals: Progressing   Interventions: CBT, DBT, Solution  Focused, Strength-based, Supportive, and Reframing  Therapist Response: 12:00 - 12:50 pm: Occupational  therapy group w/cln E Hollan 12:50 - 1:00 pm: Clinician led check-out. Clinician assessed for immediate needs, medication compliance and efficacy, and safety concerns?  Summary: 12:00 - 12:50 pm: See OT note  12:50 - 1:00 pm: At check-out, patient contracts for safety.? Patient demonstrates some progress as evidenced by her continued engagement and by being receptive to treatment. Patient denies SI/HI/self-harm thoughts at the end of group and agrees to seek help should those thoughts/feelings occur.?   Suicidal/Homicidal: Nowithout intent/plan  Plan: ?Pt will continue in PHP and medication management while continuing to work on decreasing depression symptoms,?SI, and anxiety symptoms,?and increasing the ability to self manage symptoms.    Collaboration of Care: Medication Management AEB Dr. Rosita Kea  Patient/Guardian was advised Release of Information must be obtained prior to any record release in order to collaborate their care with an outside provider. Patient/Guardian was advised if they have not already done so to contact the registration department to sign all necessary forms in order for [place] to release information regarding their care.   Consent: Patient/Guardian gives verbal consent for treatment and assignment of benefits for services provided during this visit. Patient/Guardian expressed understanding and agreed to proceed.   Diagnosis: Severe episode of recurrent major depressive disorder, without psychotic features ([place]) [F33.2]    1. Severe episode of recurrent major depressive disorder, without psychotic features ([place])   2. Panic disorder       Lorin Glass, LCSW
